# Patient Record
Sex: Male | Born: 1940 | Hispanic: No | Smoking: Never smoker
Health system: Southern US, Community
[De-identification: ages and names within clinical notes are randomized; demographics above are authoritative.]

## PROBLEM LIST (undated history)

## (undated) DIAGNOSIS — E46 Unspecified protein-calorie malnutrition: Secondary | ICD-10-CM

## (undated) DIAGNOSIS — D649 Anemia, unspecified: Secondary | ICD-10-CM

## (undated) DIAGNOSIS — IMO0002 Reserved for concepts with insufficient information to code with codable children: Secondary | ICD-10-CM

## (undated) DIAGNOSIS — J45909 Unspecified asthma, uncomplicated: Secondary | ICD-10-CM

## (undated) DIAGNOSIS — G9341 Metabolic encephalopathy: Secondary | ICD-10-CM

## (undated) DIAGNOSIS — C801 Malignant (primary) neoplasm, unspecified: Secondary | ICD-10-CM

## (undated) DIAGNOSIS — I10 Essential (primary) hypertension: Secondary | ICD-10-CM

## (undated) DIAGNOSIS — T66XXXA Radiation sickness, unspecified, initial encounter: Secondary | ICD-10-CM

## (undated) HISTORY — PX: ABDOMINAL AORTIC ANEURYSM REPAIR: SUR1152

---

## 2015-12-21 ENCOUNTER — Emergency Department
Admission: EM | Admit: 2015-12-21 | Discharge: 2015-12-22 | Disposition: A | Discharge status: Home / Self Care | Payer: Medicare Other | Attending: Emergency Medicine | Admitting: Emergency Medicine

## 2015-12-21 ENCOUNTER — Encounter: Payer: Self-pay | Admitting: *Deleted

## 2015-12-21 ENCOUNTER — Emergency Department: Payer: Medicare Other

## 2015-12-21 DIAGNOSIS — R1012 Left upper quadrant pain: Secondary | ICD-10-CM

## 2015-12-21 DIAGNOSIS — R1084 Generalized abdominal pain: Secondary | ICD-10-CM

## 2015-12-21 LAB — URINALYSIS COMPLETE WITH MICROSCOPIC (ARMC ONLY)
BACTERIA UA: NONE SEEN
Bilirubin Urine: NEGATIVE
GLUCOSE, UA: NEGATIVE mg/dL
HGB URINE DIPSTICK: NEGATIVE
Ketones, ur: NEGATIVE mg/dL
LEUKOCYTES UA: NEGATIVE
NITRITE: NEGATIVE
Protein, ur: NEGATIVE mg/dL
SPECIFIC GRAVITY, URINE: 1.012 (ref 1.005–1.030)
pH: 6 (ref 5.0–8.0)

## 2015-12-21 LAB — CBC
HCT: 37.9 % — ABNORMAL LOW (ref 40.0–52.0)
HEMOGLOBIN: 12.8 g/dL — AB (ref 13.0–18.0)
MCH: 28.1 pg (ref 26.0–34.0)
MCHC: 33.8 g/dL (ref 32.0–36.0)
MCV: 83.1 fL (ref 80.0–100.0)
PLATELETS: 240 10*3/uL (ref 150–440)
RBC: 4.55 MIL/uL (ref 4.40–5.90)
RDW: 15 % — ABNORMAL HIGH (ref 11.5–14.5)
WBC: 14.6 10*3/uL — AB (ref 3.8–10.6)

## 2015-12-21 LAB — COMPREHENSIVE METABOLIC PANEL
ALK PHOS: 80 U/L (ref 38–126)
ALT: 8 U/L — ABNORMAL LOW (ref 17–63)
ANION GAP: 9 (ref 5–15)
AST: 15 U/L (ref 15–41)
Albumin: 3 g/dL — ABNORMAL LOW (ref 3.5–5.0)
BILIRUBIN TOTAL: 0.7 mg/dL (ref 0.3–1.2)
BUN: 14 mg/dL (ref 6–20)
CALCIUM: 12 mg/dL — AB (ref 8.9–10.3)
CO2: 30 mmol/L (ref 22–32)
CREATININE: 0.98 mg/dL (ref 0.61–1.24)
Chloride: 99 mmol/L — ABNORMAL LOW (ref 101–111)
Glucose, Bld: 96 mg/dL (ref 65–99)
Potassium: 3.3 mmol/L — ABNORMAL LOW (ref 3.5–5.1)
SODIUM: 138 mmol/L (ref 135–145)
TOTAL PROTEIN: 7.1 g/dL (ref 6.5–8.1)

## 2015-12-21 LAB — LIPASE, BLOOD: Lipase: 15 U/L (ref 11–51)

## 2015-12-21 MED ORDER — IOPAMIDOL (ISOVUE-300) INJECTION 61%: 30.0000 mL | Freq: Once | INTRAVENOUS | Status: DC

## 2015-12-21 NOTE — ED Triage Notes (Signed)
Pt reports he has abd pain.  No n/v/d.  Pt states it feels like gas pain.  No back pain.  Last bm was yesterday.   Pt alert.

## 2015-12-21 NOTE — ED Provider Notes (Signed)
[facility] Emergency Department Provider Note    ____________________________________________   I have reviewed the triage vital signs and the nursing notes.   HISTORY  Chief Complaint Abdominal Pain   History limited by: Not Limited   HPI Brian Hughes is a 75 y.o. male who presents to the emergency department today because of concerns for abdominal pain. He states is going on off for roughly 4 months. He describes the pain is been somewhat generalized. It does sound like he gets worse after he eats because he feels he gets bloated with gas. He is currently seen a GI doctor who apparently recommended that he come to the emergency department he called today and said the pain was worse that he has not been eating and has had weight loss. Patient does state that he has had multiple pounds of weight loss have been unintentional. He denies any fevers.     No past medical history on file.  There are no active problems to display for this patient.   No past surgical history on file.  Prior to Admission medications   Not on File    Allergies Review of patient's allergies indicates no known allergies.  No family history on file.  Social History Social History  Substance Use Topics  . Smoking status: Never Smoker  . Smokeless tobacco: Never Used  . Alcohol use No    Review of Systems  Constitutional: Negative for fever. Positive for weight loss.  Cardiovascular: Negative for chest pain. Respiratory: Negative for shortness of breath. Gastrointestinal: Positive for abdominal pain. Genitourinary: Negative for dysuria. Musculoskeletal: Negative for back pain. Skin: Negative for rash. Neurological: Negative for headaches, focal weakness or numbness.  10-point ROS otherwise negative.  ____________________________________________   PHYSICAL EXAM:  VITAL SIGNS: ED Triage Vitals  Enc Vitals Group     BP 12/21/15 1752 (!) 143/105   Pulse Rate 12/21/15 1752 94     Resp 12/21/15 1752 20     Temp 12/21/15 1752 98.7 F (37.1 C)     Temp Source 12/21/15 1752 Oral     SpO2 12/21/15 1752 95 %     Weight 12/21/15 1753 217 lb 9 oz (98.7 kg)     Height 12/21/15 1753 6\' 4"  (1.93 m)     Head Circumference --      Peak Flow --      Pain Score 12/21/15 1757 7   Constitutional: Alert and oriented. Well appearing and in no distress. Eyes: Conjunctivae are normal. Normal extraocular movements. ENT   Head: Normocephalic and atraumatic.   Nose: No congestion/rhinnorhea.   Mouth/Throat: Mucous membranes are moist.   Neck: No stridor. Hematological/Lymphatic/Immunilogical: No cervical lymphadenopathy. Cardiovascular: Normal rate, regular rhythm.  No murmurs, rubs, or gallops. Respiratory: Normal respiratory effort without tachypnea nor retractions. Breath sounds are clear and equal bilaterally. No wheezes/rales/rhonchi. Gastrointestinal: Soft and minimally diffusely tender to palpation. No distention.  Genitourinary: Deferred Musculoskeletal: Normal range of motion in all extremities. No lower extremity edema. Neurologic:  Normal speech and language. No gross focal neurologic deficits are appreciated.  Skin:  Skin is warm, dry and intact. No rash noted. Psychiatric: Mood and affect are normal. Speech and behavior are normal. Patient exhibits appropriate insight and judgment.  ____________________________________________    LABS (pertinent positives/negatives)  Labs Reviewed  COMPREHENSIVE METABOLIC PANEL - Abnormal; Notable for the following:       Result Value   Potassium 3.3 (*)    Chloride 99 (*)    Calcium  12.0 (*)    Albumin 3.0 (*)    ALT 8 (*)    All other components within normal limits  CBC - Abnormal; Notable for the following:    WBC 14.6 (*)    Hemoglobin 12.8 (*)    HCT 37.9 (*)    RDW 15.0 (*)    All other components within normal limits  URINALYSIS COMPLETEWITH MICROSCOPIC ([facility] ONLY) -  Abnormal; Notable for the following:    Color, Urine YELLOW (*)    APPearance CLEAR (*)    Squamous Epithelial / LPF 0-5 (*)    All other components within normal limits  LIPASE, BLOOD     ____________________________________________   EKG  None  ____________________________________________    RADIOLOGY  Abd x-rays pending  ____________________________________________   PROCEDURES  Procedures  ____________________________________________   INITIAL IMPRESSION / ASSESSMENT AND PLAN / ED COURSE  Pertinent labs & imaging results that were available during my care of the patient were reviewed by me and considered in my medical decision making (see chart for details).  Patient presented to the emergency department today with primary concerns for abdominal pain. This is been a chronic issue for the past 4 months. Patient however is also concern for weight loss and weakness. Patient have a leukocytosis on his blood work. Abdominal was mildly diffusely tender to palpation. I did recommend CT scan however patient declined we will however. We will then get x-rays to evaluate for any abnormal gas patterns. Did explain to patient he might have concern for possible cancer given history of weight loss. Patient understood and still declined CT scan. Patient was also found to be slightly hypercalcemic. Patient states he does take calcium supplements. Was advised to stop taking this. Abdominal x-ray pending at time of sign out however will prepare  Work in the event that it's negative. ____________________________________________   FINAL CLINICAL IMPRESSION(S) / ED DIAGNOSES  Final diagnoses:  Generalized abdominal pain  Hypercalcemia     Note: This dictation was prepared with Dragon dictation. Any transcriptional errors that result from this process are unintentional    Nance Pear, MD 12/22/15 0006

## 2015-12-21 NOTE — ED Notes (Signed)
Pt reports that when he "takes my fist and pushes it in the my left upper stomach the pain goes away".  Pt denies that the pain increases afterwards, but states that the pain does come back when he releases the pressure.  Dr. Archie Balboa notified.

## 2015-12-21 NOTE — ED Notes (Signed)
Pt refusing CT and MRI at this time.  Pt states that it makes him weaker.  EDP at bedside and notified of this.

## 2015-12-22 MED ORDER — SODIUM CHLORIDE 0.9 % IV BOLUS (SEPSIS)
1000.0000 mL | Freq: Once | INTRAVENOUS | Status: AC
  Administered 2015-12-22: 1000 mL via INTRAVENOUS

## 2015-12-22 MED ORDER — SIMETHICONE 80 MG PO CHEW: 80.0000 mg | CHEWABLE_TABLET | Freq: Three times a day (TID) | ORAL | 2 refills | Status: AC

## 2015-12-22 NOTE — ED Provider Notes (Signed)
-----------------------------------------   12:44 AM on 12/22/2015 -----------------------------------------  Patient care is him from Dr. Archie Balboa. Patient's abdominal x-rays unrevealing. Patient's labs are largely within normal limits besides elevated calcium. I discussed with the patient decreasing his calcium supplementation from twice daily to once daily. Patient does have a mild leukocytosis of 14,000. On my abdominal exam the patient is nontender, states it only hurts if you push very hard with the point of your finger. Urinalysis is normal. I again discussed with the patient obtaining a CT scan. Patient states both times he has had CT scans as made extremely weak, and he is adamantly opposed to obtaining a CT scan. I discussed obtaining a CT renal scan which would be no contrast of any sort, the patient states that is what he had last time he still felt extremely weak after obtaining the CT scan. Patient states he will call his GI doctor tomorrow. Patient is describing a large amount of abdominal gas when he eats which then prevents him from continuing to eat. We will place the patient on simethicone, he will call GI medicine tomorrow. Patient is agreeable to this plan.   Harvest Dark, MD 12/22/15 [phone]

## 2015-12-22 NOTE — Discharge Instructions (Signed)
Please STOP taking your calcium supplements. You have too much calcium in your blood. Please seek medical attention for any high fevers, chest pain, shortness of breath, change in behavior, persistent vomiting, bloody stool or any other new or concerning symptoms.

## 2016-01-18 ENCOUNTER — Emergency Department: Payer: Medicare Other

## 2016-01-18 ENCOUNTER — Encounter: Payer: Self-pay | Admitting: Emergency Medicine

## 2016-01-18 ENCOUNTER — Inpatient Hospital Stay
Admission: EM | Admit: 2016-01-18 | Discharge: 2016-02-02 | DRG: 823 | Disposition: A | Discharge status: Hospice | Payer: Medicare Other | Attending: Family Medicine | Admitting: Family Medicine

## 2016-01-18 DIAGNOSIS — C801 Malignant (primary) neoplasm, unspecified: Secondary | ICD-10-CM

## 2016-01-18 DIAGNOSIS — Z6829 Body mass index (BMI) 29.0-29.9, adult: Secondary | ICD-10-CM

## 2016-01-18 DIAGNOSIS — K5900 Constipation, unspecified: Secondary | ICD-10-CM

## 2016-01-18 DIAGNOSIS — I517 Cardiomegaly: Secondary | ICD-10-CM | POA: Diagnosis not present

## 2016-01-18 DIAGNOSIS — I714 Abdominal aortic aneurysm, without rupture, unspecified: Secondary | ICD-10-CM

## 2016-01-18 DIAGNOSIS — Z515 Encounter for palliative care: Secondary | ICD-10-CM | POA: Diagnosis present

## 2016-01-18 DIAGNOSIS — R0902 Hypoxemia: Secondary | ICD-10-CM | POA: Diagnosis present

## 2016-01-18 DIAGNOSIS — R32 Unspecified urinary incontinence: Secondary | ICD-10-CM | POA: Diagnosis not present

## 2016-01-18 DIAGNOSIS — R1902 Left upper quadrant abdominal swelling, mass and lump: Secondary | ICD-10-CM

## 2016-01-18 DIAGNOSIS — C8333 Diffuse large B-cell lymphoma, intra-abdominal lymph nodes: Principal | ICD-10-CM | POA: Diagnosis present

## 2016-01-18 DIAGNOSIS — C8590 Non-Hodgkin lymphoma, unspecified, unspecified site: Secondary | ICD-10-CM | POA: Diagnosis not present

## 2016-01-18 DIAGNOSIS — M6281 Muscle weakness (generalized): Secondary | ICD-10-CM

## 2016-01-18 DIAGNOSIS — K567 Ileus, unspecified: Secondary | ICD-10-CM | POA: Diagnosis not present

## 2016-01-18 DIAGNOSIS — I10 Essential (primary) hypertension: Secondary | ICD-10-CM | POA: Diagnosis not present

## 2016-01-18 DIAGNOSIS — Z9109 Other allergy status, other than to drugs and biological substances: Secondary | ICD-10-CM

## 2016-01-18 DIAGNOSIS — G9341 Metabolic encephalopathy: Secondary | ICD-10-CM | POA: Diagnosis not present

## 2016-01-18 DIAGNOSIS — E43 Unspecified severe protein-calorie malnutrition: Secondary | ICD-10-CM | POA: Diagnosis present

## 2016-01-18 DIAGNOSIS — T451X5A Adverse effect of antineoplastic and immunosuppressive drugs, initial encounter: Secondary | ICD-10-CM | POA: Diagnosis not present

## 2016-01-18 DIAGNOSIS — J449 Chronic obstructive pulmonary disease, unspecified: Secondary | ICD-10-CM | POA: Diagnosis present

## 2016-01-18 DIAGNOSIS — E790 Hyperuricemia without signs of inflammatory arthritis and tophaceous disease: Secondary | ICD-10-CM | POA: Diagnosis present

## 2016-01-18 DIAGNOSIS — D72829 Elevated white blood cell count, unspecified: Secondary | ICD-10-CM | POA: Diagnosis present

## 2016-01-18 DIAGNOSIS — Y92239 Unspecified place in hospital as the place of occurrence of the external cause: Secondary | ICD-10-CM | POA: Diagnosis not present

## 2016-01-18 DIAGNOSIS — Z01810 Encounter for preprocedural cardiovascular examination: Secondary | ICD-10-CM | POA: Diagnosis not present

## 2016-01-18 DIAGNOSIS — Z66 Do not resuscitate: Secondary | ICD-10-CM | POA: Diagnosis present

## 2016-01-18 DIAGNOSIS — R410 Disorientation, unspecified: Secondary | ICD-10-CM | POA: Diagnosis not present

## 2016-01-18 DIAGNOSIS — K869 Disease of pancreas, unspecified: Secondary | ICD-10-CM | POA: Diagnosis not present

## 2016-01-18 DIAGNOSIS — R1900 Intra-abdominal and pelvic swelling, mass and lump, unspecified site: Secondary | ICD-10-CM

## 2016-01-18 DIAGNOSIS — E8770 Fluid overload, unspecified: Secondary | ICD-10-CM | POA: Diagnosis not present

## 2016-01-18 DIAGNOSIS — R2681 Unsteadiness on feet: Secondary | ICD-10-CM

## 2016-01-18 DIAGNOSIS — R140 Abdominal distension (gaseous): Secondary | ICD-10-CM

## 2016-01-18 DIAGNOSIS — J690 Pneumonitis due to inhalation of food and vomit: Secondary | ICD-10-CM

## 2016-01-18 DIAGNOSIS — C8593 Non-Hodgkin lymphoma, unspecified, intra-abdominal lymph nodes: Secondary | ICD-10-CM | POA: Diagnosis not present

## 2016-01-18 DIAGNOSIS — K921 Melena: Secondary | ICD-10-CM

## 2016-01-18 DIAGNOSIS — R1084 Generalized abdominal pain: Secondary | ICD-10-CM | POA: Diagnosis not present

## 2016-01-18 DIAGNOSIS — Z7189 Other specified counseling: Secondary | ICD-10-CM

## 2016-01-18 DIAGNOSIS — E876 Hypokalemia: Secondary | ICD-10-CM | POA: Diagnosis present

## 2016-01-18 DIAGNOSIS — Z79899 Other long term (current) drug therapy: Secondary | ICD-10-CM

## 2016-01-18 DIAGNOSIS — R1907 Generalized intra-abdominal and pelvic swelling, mass and lump: Secondary | ICD-10-CM

## 2016-01-18 DIAGNOSIS — J90 Pleural effusion, not elsewhere classified: Secondary | ICD-10-CM | POA: Diagnosis not present

## 2016-01-18 DIAGNOSIS — R1909 Other intra-abdominal and pelvic swelling, mass and lump: Secondary | ICD-10-CM | POA: Diagnosis not present

## 2016-01-18 DIAGNOSIS — R58 Hemorrhage, not elsewhere classified: Secondary | ICD-10-CM

## 2016-01-18 DIAGNOSIS — R451 Restlessness and agitation: Secondary | ICD-10-CM | POA: Diagnosis not present

## 2016-01-18 DIAGNOSIS — D709 Neutropenia, unspecified: Secondary | ICD-10-CM | POA: Diagnosis not present

## 2016-01-18 DIAGNOSIS — K922 Gastrointestinal hemorrhage, unspecified: Secondary | ICD-10-CM

## 2016-01-18 HISTORY — DX: Radiation sickness, unspecified, initial encounter: T66.XXXA

## 2016-01-18 HISTORY — DX: Anemia, unspecified: D64.9

## 2016-01-18 HISTORY — DX: Hypercalcemia: E83.52

## 2016-01-18 HISTORY — DX: Metabolic encephalopathy: G93.41

## 2016-01-18 HISTORY — DX: Unspecified asthma, uncomplicated: J45.909

## 2016-01-18 HISTORY — DX: Reserved for concepts with insufficient information to code with codable children: IMO0002

## 2016-01-18 HISTORY — DX: Malignant (primary) neoplasm, unspecified: C80.1

## 2016-01-18 HISTORY — DX: Unspecified protein-calorie malnutrition: E46

## 2016-01-18 HISTORY — DX: Essential (primary) hypertension: I10

## 2016-01-18 LAB — BASIC METABOLIC PANEL
ANION GAP: 12 (ref 5–15)
BUN: 22 mg/dL — ABNORMAL HIGH (ref 6–20)
CALCIUM: 14.8 mg/dL — AB (ref 8.9–10.3)
CO2: 36 mmol/L — AB (ref 22–32)
Chloride: 87 mmol/L — ABNORMAL LOW (ref 101–111)
Creatinine, Ser: 1.1 mg/dL (ref 0.61–1.24)
GFR calc non Af Amer: >60 mL/min (ref >60)
Glucose, Bld: 125 mg/dL — ABNORMAL HIGH (ref 65–99)
POTASSIUM: 2.9 mmol/L — AB (ref 3.5–5.1)
Sodium: 135 mmol/L (ref 135–145)

## 2016-01-18 LAB — CBC
HEMATOCRIT: 36.8 % — AB (ref 40.0–52.0)
HEMOGLOBIN: 12.4 g/dL — AB (ref 13.0–18.0)
MCH: 28 pg (ref 26.0–34.0)
MCHC: 33.6 g/dL (ref 32.0–36.0)
MCV: 83.3 fL (ref 80.0–100.0)
Platelets: 404 10*3/uL (ref 150–440)
RBC: 4.42 MIL/uL (ref 4.40–5.90)
RDW: 15.2 % — ABNORMAL HIGH (ref 11.5–14.5)
WBC: 18.5 10*3/uL — ABNORMAL HIGH (ref 3.8–10.6)

## 2016-01-18 LAB — URINALYSIS COMPLETE WITH MICROSCOPIC (ARMC ONLY)
BACTERIA UA: NONE SEEN
Bilirubin Urine: NEGATIVE
Glucose, UA: NEGATIVE mg/dL
Hgb urine dipstick: NEGATIVE
KETONES UR: NEGATIVE mg/dL
Leukocytes, UA: NEGATIVE
NITRITE: NEGATIVE
PH: 5 (ref 5.0–8.0)
PROTEIN: NEGATIVE mg/dL
SPECIFIC GRAVITY, URINE: 1.014 (ref 1.005–1.030)

## 2016-01-18 LAB — TROPONIN I: Troponin I: <0.03 ng/mL (ref <0.03)

## 2016-01-18 LAB — AMMONIA: Ammonia: 9 umol/L (ref 9–35)

## 2016-01-18 LAB — TSH
TSH: 1.789 u[IU]/mL (ref 0.350–4.500)
TSH: 1.406 u[IU]/mL (ref 0.350–4.500)

## 2016-01-18 LAB — HEPATIC FUNCTION PANEL
ALBUMIN: 2.5 g/dL — AB (ref 3.5–5.0)
ALK PHOS: 72 U/L (ref 38–126)
ALT: 10 U/L — ABNORMAL LOW (ref 17–63)
AST: 23 U/L (ref 15–41)
BILIRUBIN TOTAL: 0.9 mg/dL (ref 0.3–1.2)
Bilirubin, Direct: 0.2 mg/dL (ref 0.1–0.5)
Indirect Bilirubin: 0.7 mg/dL (ref 0.3–0.9)
Total Protein: 6.9 g/dL (ref 6.5–8.1)

## 2016-01-18 LAB — MAGNESIUM: MAGNESIUM: 1.7 mg/dL (ref 1.7–2.4)

## 2016-01-18 MED ORDER — BISACODYL 10 MG RE SUPP: 10.0000 mg | Freq: Every day | RECTAL | Status: DC | PRN

## 2016-01-18 MED ORDER — DOCUSATE SODIUM 100 MG PO CAPS
100.0000 mg | ORAL_CAPSULE | Freq: Two times a day (BID) | ORAL | Status: DC
  Administered 2016-01-19 – 2016-01-27 (×17): 100 mg via ORAL
  Filled 2016-01-18 (×20): qty 1

## 2016-01-18 MED ORDER — SODIUM CHLORIDE 0.9 % IV BOLUS (SEPSIS)
1000.0000 mL | Freq: Once | INTRAVENOUS | Status: AC
  Administered 2016-01-18: 1000 mL via INTRAVENOUS

## 2016-01-18 MED ORDER — POTASSIUM CHLORIDE IN NACL 20-0.9 MEQ/L-% IV SOLN
INTRAVENOUS | Status: DC
  Administered 2016-01-19 – 2016-01-20 (×5): via INTRAVENOUS
  Administered 2016-01-20: 200 mL/h via INTRAVENOUS
  Administered 2016-01-21 – 2016-01-22 (×5): via INTRAVENOUS
  Filled 2016-01-18 (×15): qty 1000

## 2016-01-18 MED ORDER — ONDANSETRON HCL 4 MG PO TABS: 4.0000 mg | ORAL_TABLET | Freq: Four times a day (QID) | ORAL | Status: DC | PRN

## 2016-01-18 MED ORDER — POTASSIUM CHLORIDE CRYS ER 20 MEQ PO TBCR: 40.0000 meq | EXTENDED_RELEASE_TABLET | ORAL | Status: DC

## 2016-01-18 MED ORDER — SUCRALFATE 1 G PO TABS
1.0000 g | ORAL_TABLET | Freq: Every day | ORAL | Status: DC
  Administered 2016-01-19 – 2016-01-20 (×2): 1 g via ORAL
  Filled 2016-01-18 (×2): qty 1

## 2016-01-18 MED ORDER — POTASSIUM CHLORIDE 10 MEQ/100ML IV SOLN
10.0000 meq | INTRAVENOUS | Status: DC
Start: 2016-01-18 — End: 2016-01-18

## 2016-01-18 MED ORDER — SODIUM CHLORIDE 0.9 % IV SOLN
INTRAVENOUS | Status: DC
  Administered 2016-01-18: 21:00:00 via INTRAVENOUS
  Filled 2016-01-18 (×2): qty 100

## 2016-01-18 MED ORDER — FENTANYL CITRATE (PF) 100 MCG/2ML IJ SOLN
50.0000 ug | Freq: Once | INTRAMUSCULAR | Status: AC
  Administered 2016-01-18: 50 ug via INTRAVENOUS
  Filled 2016-01-18: qty 2

## 2016-01-18 MED ORDER — IOPAMIDOL (ISOVUE-300) INJECTION 61%
100.0000 mL | Freq: Once | INTRAVENOUS | Status: AC | PRN
  Administered 2016-01-18: 100 mL via INTRAVENOUS

## 2016-01-18 MED ORDER — ONDANSETRON HCL 4 MG/2ML IJ SOLN
4.0000 mg | Freq: Once | INTRAMUSCULAR | Status: AC
  Administered 2016-01-18: 4 mg via INTRAVENOUS
  Filled 2016-01-18: qty 2

## 2016-01-18 MED ORDER — HYDROCODONE-ACETAMINOPHEN 5-325 MG PO TABS
1.0000 | ORAL_TABLET | ORAL | Status: DC | PRN
  Administered 2016-01-18: 1 via ORAL
  Administered 2016-01-20 – 2016-01-25 (×12): 2 via ORAL
  Administered 2016-01-26: 04:00:00 1 via ORAL
  Administered 2016-01-26 – 2016-01-30 (×10): 2 via ORAL
  Filled 2016-01-18 (×21): qty 2
  Filled 2016-01-18 (×2): qty 1
  Filled 2016-01-18 (×2): qty 2

## 2016-01-18 MED ORDER — PANTOPRAZOLE SODIUM 40 MG PO TBEC
40.0000 mg | DELAYED_RELEASE_TABLET | Freq: Every day | ORAL | Status: DC
  Administered 2016-01-19 – 2016-01-29 (×10): 40 mg via ORAL
  Filled 2016-01-18 (×11): qty 1

## 2016-01-18 MED ORDER — PIPERACILLIN-TAZOBACTAM 3.375 G IVPB 30 MIN
3.3750 g | Freq: Once | INTRAVENOUS | Status: AC
  Administered 2016-01-18: 3.375 g via INTRAVENOUS
  Filled 2016-01-18: qty 50

## 2016-01-18 MED ORDER — SODIUM CHLORIDE 0.9 % IV BOLUS (SEPSIS)
1000.0000 mL | Freq: Once | INTRAVENOUS | Status: AC
  Administered 2016-01-19: 01:00:00 1000 mL via INTRAVENOUS

## 2016-01-18 MED ORDER — LISINOPRIL 20 MG PO TABS
40.0000 mg | ORAL_TABLET | Freq: Every day | ORAL | Status: DC
  Administered 2016-01-19 – 2016-01-20 (×2): 40 mg via ORAL
  Filled 2016-01-18 (×2): qty 2

## 2016-01-18 MED ORDER — POTASSIUM CHLORIDE CRYS ER 20 MEQ PO TBCR
40.0000 meq | EXTENDED_RELEASE_TABLET | Freq: Once | ORAL | Status: AC
  Administered 2016-01-18: 40 meq via ORAL
  Filled 2016-01-18: qty 2

## 2016-01-18 MED ORDER — ONDANSETRON HCL 4 MG/2ML IJ SOLN: 4.0000 mg | Freq: Four times a day (QID) | INTRAMUSCULAR | Status: DC | PRN

## 2016-01-18 MED ORDER — ALBUTEROL SULFATE (2.5 MG/3ML) 0.083% IN NEBU
2.5000 mg | INHALATION_SOLUTION | RESPIRATORY_TRACT | Status: DC | PRN
  Administered 2016-01-23 – 2016-01-30 (×5): 2.5 mg via RESPIRATORY_TRACT
  Filled 2016-01-18 (×5): qty 3

## 2016-01-18 MED ORDER — ENOXAPARIN SODIUM 40 MG/0.4ML ~~LOC~~ SOLN
40.0000 mg | SUBCUTANEOUS | Status: DC
  Administered 2016-01-19 – 2016-01-28 (×8): 40 mg via SUBCUTANEOUS
  Filled 2016-01-18 (×9): qty 0.4

## 2016-01-18 MED ORDER — IOPAMIDOL (ISOVUE-300) INJECTION 61%
30.0000 mL | Freq: Once | INTRAVENOUS | Status: AC
  Administered 2016-01-18: 30 mL via ORAL

## 2016-01-18 MED ORDER — FUROSEMIDE 10 MG/ML IJ SOLN
40.0000 mg | Freq: Once | INTRAMUSCULAR | Status: AC
  Administered 2016-01-19: 01:00:00 40 mg via INTRAVENOUS
  Filled 2016-01-18: qty 4

## 2016-01-18 MED ORDER — ACETAMINOPHEN 325 MG PO TABS
650.0000 mg | ORAL_TABLET | Freq: Four times a day (QID) | ORAL | Status: DC | PRN
  Administered 2016-01-28: 09:00:00 650 mg via ORAL
  Filled 2016-01-18: qty 2

## 2016-01-18 MED ORDER — ALBUTEROL SULFATE HFA 108 (90 BASE) MCG/ACT IN AERS: 2.0000 | INHALATION_SPRAY | RESPIRATORY_TRACT | Status: DC | PRN

## 2016-01-18 MED ORDER — VANCOMYCIN HCL 10 G IV SOLR
1500.0000 mg | Freq: Once | INTRAVENOUS | Status: AC
  Administered 2016-01-18: 1500 mg via INTRAVENOUS
  Filled 2016-01-18: qty 1500

## 2016-01-18 MED ORDER — POLYETHYLENE GLYCOL 3350 17 G PO PACK: 17.0000 g | PACK | Freq: Every day | ORAL | Status: DC | PRN

## 2016-01-18 MED ORDER — ACETAMINOPHEN 650 MG RE SUPP: 650.0000 mg | Freq: Four times a day (QID) | RECTAL | Status: DC | PRN

## 2016-01-18 MED ORDER — SODIUM CHLORIDE 0.9% FLUSH
3.0000 mL | Freq: Two times a day (BID) | INTRAVENOUS | Status: DC
  Administered 2016-01-19 – 2016-01-30 (×13): 3 mL via INTRAVENOUS

## 2016-01-18 MED ORDER — SENNA 8.6 MG PO TABS
1.0000 | ORAL_TABLET | Freq: Two times a day (BID) | ORAL | Status: DC
  Administered 2016-01-19 – 2016-01-27 (×17): 8.6 mg via ORAL
  Filled 2016-01-18 (×19): qty 1

## 2016-01-18 NOTE — ED Notes (Signed)
Admitting MD at bedside.

## 2016-01-18 NOTE — ED Notes (Addendum)
Patient transported to XR. 

## 2016-01-18 NOTE — H&P (Addendum)
[facility] [place] [facility] [place] NAME: Justn Enterline    MR#:  [id number]  DATE OF BIRTH:  Dec 14, 1940  DATE OF ADMISSION:  01/18/2016  PRIMARY CARE PHYSICIAN: No PCP Per Patient   REQUESTING/REFERRING PHYSICIAN: Dr. Mariea Clonts  CHIEF COMPLAINT:   Chief Complaint  Patient presents with  . Weakness    HISTORY OF PRESENT ILLNESS:  Karl Buhrman  is a 75 y.o. male with a known history of Asthma, hypertension presents to the emergency room complaining of many months of vague abdominal pain. He has had worsening weakness some on and off confusion as per his friend at bedside. Patient was seen by GI Dr. Vira Agar in September and was scheduled for a colonoscopy. Date unknown. Here in the emergency room a CT scan of the abdomen and pelvis was done which shows a large 20 cm mass extending over the bowel spleen and pancreas. Primary unknown. High suspicion for lymphoma. She raises concern for lingular pneumonia but CT scan of the abdomen shows no infiltrates in the lower part of the lungs. IV Zosyn and vancomycin given to the patient. He has lost 40 pounds in the last 3 months. Poor appetite. Increased urination. He has hypokalemia and hypercalcemia on blood work.  PAST MEDICAL HISTORY:   Past Medical History:  Diagnosis Date  . Asthma   . Cancer ([place])   . Hypertension     PAST SURGICAL HISTORY:  No past surgical history on file.  SOCIAL HISTORY:   Social History  Substance Use Topics  . Smoking status: Never Smoker  . Smokeless tobacco: Never Used  . Alcohol use No    FAMILY HISTORY:   Family History  Problem Relation Age of Onset  . Jaundice Father     DRUG ALLERGIES:   Allergies  Allergen Reactions  . Bupropion Nausea Only and Nausea And Vomiting  . Flunisolide     Other reaction(s): Other (See Comments) Wheezing  . Lisinopril Cough  . Mometasone     Other reaction(s): Other (See Comments) Wheezing  . Terazosin     Other  reaction(s): Other (See Comments) Fatigue  . Valsartan Cough    REVIEW OF SYSTEMS:   Review of Systems  Constitutional: Positive for malaise/fatigue and weight loss. Negative for chills and fever.  HENT: Negative for hearing loss and nosebleeds.   Eyes: Negative for blurred vision, double vision and pain.  Respiratory: Negative for cough, hemoptysis, sputum production, shortness of breath and wheezing.   Cardiovascular: Negative for chest pain, palpitations, orthopnea and leg swelling.  Gastrointestinal: Positive for abdominal pain, constipation, nausea and vomiting. Negative for diarrhea.  Genitourinary: Negative for dysuria and hematuria.  Musculoskeletal: Positive for back pain. Negative for falls and myalgias.  Skin: Negative for rash.  Neurological: Positive for dizziness and weakness. Negative for tremors, sensory change, speech change, focal weakness, seizures and headaches.  Endo/Heme/Allergies: Does not bruise/bleed easily.  Psychiatric/Behavioral: Positive for memory loss. Negative for depression. The patient is not nervous/anxious.     MEDICATIONS AT HOME:   Prior to Admission medications   Medication Sig Start Date End Date Taking? Authorizing Provider  lisinopril-hydrochlorothiazide (PRINZIDE,ZESTORETIC) 10-12.5 MG tablet Take 1 tablet by mouth daily. 11/15/15  Yes Historical Provider, MD  pantoprazole (PROTONIX) 40 MG tablet Take 1 tablet by mouth daily. 11/15/15  Yes Historical Provider, MD  simethicone (GAS-X) 80 MG chewable tablet Chew 1 tablet (80 mg total) by mouth 3 (three) times daily. 12/22/15 12/21/16 Yes Harvest Dark, MD  sucralfate (  CARAFATE) 1 g tablet Take 1 tablet by mouth daily. 12/13/15  Yes Historical Provider, MD  VENTOLIN HFA 108 (90 Base) MCG/ACT inhaler Inhale 1-2 puffs into the lungs every 6 (six) hours as needed.  10/15/15  Yes Historical Provider, MD     VITAL SIGNS:  Blood pressure (!) 148/73, pulse 75, temperature 97.4 F (36.3 C), temperature  source Oral, resp. rate (!) 23, height 6\' 4"  (1.93 m), weight 90.7 kg (200 lb), SpO2 94 %.  PHYSICAL EXAMINATION:  Physical Exam  GENERAL:  75 y.o.-year-old patient lying in the bed with no acute distress.  EYES: Pupils equal, round, reactive to light and accommodation. No scleral icterus. Extraocular muscles intact.  HEENT: Head atraumatic, normocephalic. Oropharynx and nasopharynx clear. No oropharyngeal erythema, moist oral mucosa  NECK:  Supple, no jugular venous distention. No thyroid enlargement, no tenderness.  LUNGS: Normal breath sounds bilaterally, no wheezing, rales, rhonchi. No use of accessory muscles of respiration.  CARDIOVASCULAR: S1, S2 normal. No murmurs, rubs, or gallops.  ABDOMEN: Soft, nondistended. Bowel sounds present. No organomegaly or mass. Left flank tenderness on deep palpation. EXTREMITIES: No pedal edema, cyanosis, or clubbing. + 2 pedal & radial pulses b/l.   NEUROLOGIC: Cranial nerves II through XII are intact. No focal Motor or sensory deficits appreciated b/l PSYCHIATRIC: The patient is alert and oriented x 3. Good affect.  SKIN: No obvious rash, lesion, or ulcer.   LABORATORY PANEL:   CBC  Recent Labs Lab 01/18/16 1555  WBC 18.5*  HGB 12.4*  HCT 36.8*  PLT 404   ------------------------------------------------------------------------------------------------------------------  Chemistries   Recent Labs Lab 01/18/16 1555 01/18/16 1911  NA 135  --   K 2.9*  --   CL 87*  --   CO2 36*  --   GLUCOSE 125*  --   BUN 22*  --   CREATININE 1.10  --   CALCIUM 14.8*  --   AST  --  23  ALT  --  10*  ALKPHOS  --  72  BILITOT  --  0.9   ------------------------------------------------------------------------------------------------------------------  Cardiac Enzymes No results for input(s): TROPONINI in the last 168  hours. ------------------------------------------------------------------------------------------------------------------  RADIOLOGY:  Dg Chest 2 View  Result Date: 01/18/2016 CLINICAL DATA:  Pt says he has been weak and unable to walk and be steady on his feet for about 3 weeks. Hx of asthma. Former smoker EXAM: CHEST  2 VIEW COMPARISON:  12/21/2015 FINDINGS: Heart size is normal. There is patchy infiltrate in the lingula consistent with infectious process. No pulmonary edema. No pleural effusions. Mild bronchitic changes are present. IMPRESSION: 1. Bronchitic changes. 2. Infiltrate in the lingula. Electronically Signed   By: Nolon Nations M.D.   On: 01/18/2016 19:31   Ct Head Wo Contrast  Result Date: 01/18/2016 CLINICAL DATA:  75 year old male with abdominal pain, large abdominal tumor, increased weakness. Falls and inability to walk. Initial encounter. EXAM: CT HEAD WITHOUT CONTRAST TECHNIQUE: Contiguous axial images were obtained from the base of the skull through the vertex without intravenous contrast. COMPARISON:  CT Abdomen and Pelvis from today reported separately. 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 No midline shift, ventriculomegaly, mass effect, parenchymal mass lesion, intracranial hemorrhage or evidence of cortically based acute infarction. Gray-white matter differentiation is within normal limits throughout the brain. Cerebral volume is within normal limits for age. Vascular: Calcified atherosclerosis at the skull base. Skull: No osseous abnormality identified. Sinuses/Orbits: Visualized paranasal sinuses and mastoids are  well pneumatized. Right maxillary periosteal thickening. Other: No acute orbit or scalp soft tissue finding. IMPRESSION: Essentially normal for age noncontrast CT appearance of the brain. Note that early metastatic disease to the brain is difficult to exclude in the absence of intravenous  contrast, and Brain MRI without and with contrast would best stage the brain as necessary. Electronically Signed   By: Genevie Ann M.D.   On: 01/18/2016 20:40   Ct Abdomen Pelvis W Contrast  Addendum Date: 01/18/2016   ADDENDUM REPORT: 01/18/2016 20:46 ADDENDUM: Study discussed by telephone with Dr. Eula Listen on 01/18/2016 at 20:45 . Electronically Signed   By: Genevie Ann M.D.   On: 01/18/2016 20:46   Result Date: 01/18/2016 CLINICAL DATA:  75 year old male with bilateral lower abdominal pain. Increased weakness. Falls. Inability to walk. Initial encounter. EXAM: CT ABDOMEN AND PELVIS WITH CONTRAST TECHNIQUE: Multidetector CT imaging of the abdomen and pelvis was performed using the standard protocol following bolus administration of intravenous contrast. CONTRAST:  170mL ISOVUE-300 IOPAMIDOL (ISOVUE-300) INJECTION 61% COMPARISON:  Chest and abdominal radiographic series 10517. FINDINGS: Lower chest: No pericardial or pleural effusion. Lower lobe subpleural reticular opacity. No upper abdominal free air. Hepatobiliary: Negative liver. Surgically absent gallbladder. No intra or extrahepatic biliary duct enlargement. Pancreas/Spleen: There is a large heterogeneously enhancing solid mass occupying the left upper quadrant and lesser sac inseparable from the spleen, the proximal stomach, left adrenal gland, and the pancreatic body and tail. The mass encompasses 19.7 x 19.6 x 16 cm (AP by transverse by CC). The splenic flexure appears to be inferiorly displaced along the anterior and caudal aspect of the large tumor. There is mass effect on the proximal gastric lumen. The pancreatic head and uncinate process are unaffected and appear normal. There is superimposed extensive gastrohepatic ligament, retrocrural, and celiac axis lymphadenopathy, with individual nodal enlargement up to 3.6 cm short axis. New numerous small but increased in number mesenteric nodes throughout the root of the mesenteric. Adrenals/Urinary  Tract: The left adrenal gland is involved as above. The right adrenal gland is normal. Bilateral renal enhancement and post contrast excretion is within normal limits. Stomach/Bowel: Abnormal proximal stomach as above. Oral contrast was administered and has reached the distal small bowel, but not yet reached the terminal ileum. The duodenum and proximal small bowel loops are not directly involved by the large tumor. As stated above, the splenic flexure appears displaced and not directly involved. There is widespread diverticulosis of the colon. The large bowel is largely decompressed. No dilated small bowel. Vascular/Lymphatic: Large up to 6.4 cm diameter infrarenal abdominal aortic aneurysm with prominent mural plaque or thrombus. See series 2, image 54 and sagittal image 64. Calcified aortic atherosclerosis. Major arterial structures in the abdomen and pelvis remain patent. 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 The portal venous system is patent. Extensive upper abdominal lymphadenopathy as stated above. Reproductive: Penile implant. Other: Small volume pelvic free fluid. Unremarkable urinary bladder. Musculoskeletal: No acute or suspicious osseous lesion identified. IMPRESSION: 1. Large, nearly 20 cm heterogeneously enhancing and partially cystic or necrotic soft tissue mass inseparable from the proximal stomach, spleen, body and tail of the pancreas, and left adrenal gland. Extensive upper abdominal and retrocrural lymphadenopathy. No evidence of liver metastatic disease or biliary obstruction. Top differential considerations include Lymphoma, GIST, other gastric carcinoma, other primary splenic malignancy, and less likely pancreatic carcinoma. 2. Large up to 6.4 cm infrarenal abdominal aortic aneurysm. No evidence of acute rupture. Vascular surgery consultation recommended  due to increased risk of rupture for AAA >5.5 cm. This recommendation follows ACR consensus  guidelines: White Paper of the ACR Incidental Findings Committee II on Vascular Findings. J Am Coll Radiol 2013; 10:789-794. 3. 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 Electronically Signed: By: Genevie Ann M.D. 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 Monitor Lasix. May need Zometa depending on response to therapy.  * Large abdominal mass of unknown primary High suspicion for lymphoma. Consult oncology. Pain meds added. Will need PET/CT as outpatient.  * Hypoxia Chest x-ray raise concern for a possible lingular pneumonia. CT scan of the abdomen shows no infiltrates the lingular area on the lungs. He likely has COPD with his 52-pack-year smoking history. Wean oxygen as tolerated. Nebs added.  * Leukocytosis likely due to malignancy  * Hypokalemia. Replace orally and IV.  All the records are reviewed and case discussed with ED provider. Management plans discussed with the patient, family and they are in agreement.  CODE STATUS: DNR  TOTAL TIME TAKING CARE OF THIS PATIENT: 40 minutes.   Discussed with patient in the presence of his friend at bedside. Discussed findings of CT scan and presence of large tumor. Patient in tears. Has had symptoms for many months and was waiting for an endoscopy procedure. He mentions he is interested in finding out what kind of tumor he has an options of treatment. Is hoping he will improve and return to baseline. Once endoscopy procedures or biopsy if needed. Does not want any artificial life support including ventilator or resuscitation. Orders entered  for DO NOT RESUSCITATE. Son is his healthcare power of attorney.  Advance care planning 20 min  Hillary Bow R M.D on 01/18/2016 at 9:18 PM  Between 7am to 6pm - Pager - [phone]  After 6pm go to [email or web address]  - password [email or web address] [facility]  Office  [phone]  CC: Primary care physician; No PCP Per Patient  Note: This dictation was prepared with Dragon dictation along with smaller phrase technology. Any transcriptional errors that result from this process are unintentional.

## 2016-01-18 NOTE — ED Notes (Signed)
Pt c/o abd pain x 3 months, denies N/V/D, CP and SOB.  Pt sts that he has had weakness x 2 days, "to the point that I cannot walk".  Pt alert and oriented, resp even and unlabored.

## 2016-01-18 NOTE — ED Notes (Signed)
Calcium 14.8, reported to Dr Mariea Clonts,

## 2016-01-18 NOTE — ED Provider Notes (Signed)
[facility] Emergency Department Provider Note  ____________________________________________  Time seen: Approximately 6:49 PM  I have reviewed the triage vital signs and the nursing notes.   HISTORY  Chief Complaint Weakness    HPI Brian Hughes is a 75 y.o. male with a history of hypertension, hypokalemia presenting for generalized weakness. The patient reports that for the past 4 months he has had a constant left-sided abdominal pain. He reports that he is being worked up by GI, has undergone colonoscopy which was reportedly negative. He is scheduled for endoscopy because of anemia. He has not had any associated nausea vomiting or diarrhea, fevers or chills, urinary symptoms. He states that today, he had generalized fatigue that was so bad he was unable to walk. He also reports that he has been feeling confused over the last several weeks. He denies any cough or cold symptoms, trauma.  The patient is a poor historian and gives multiple different histories that are difficult to follow sequentially. Patient denies alcohol or illicit drug abuse.   Past Medical History:  Diagnosis Date  . Cancer ([place])     There are no active problems to display for this patient.   No past surgical history on file.  Current Outpatient Rx  . Order #: [id number] Class: Historical Med  . Order #: [id number] Class: Historical Med  . Order #: [id number] Class: Print  . Order #: [id number] Class: Historical Med  . Order #: [id number] Class: Historical Med    Allergies Bupropion; Flunisolide; Lisinopril; Mometasone; Terazosin; and Valsartan  No family history on file.  Social History Social History  Substance Use Topics  . Smoking status: Never Smoker  . Smokeless tobacco: Never Used  . Alcohol use No    Review of Systems Constitutional: No fever/chills.No lightheadedness or syncope. Positive generalized weakness. Positive inability to walk due to generalized  weakness. Eyes: No visual changes. No blurred or double vision. ENT: No sore throat. No congestion or rhinorrhea. Cardiovascular: Denies chest pain. Denies palpitations. Respiratory: Denies shortness of breath.  No cough. Gastrointestinal: Positive left-sided abdominal pain.  No nausea, no vomiting.  No diarrhea.  No constipation. Last bowel movement was yesterday and it was normal. No melena or blood in the stool. Genitourinary: Negative for dysuria. No hematuria. Musculoskeletal: Negative for back pain. Skin: Negative for rash. Neurological: Negative for headaches. No focal numbness, tingling or weakness. No imbalance with walking.  10-point ROS otherwise negative.  ____________________________________________   PHYSICAL EXAM:  VITAL SIGNS: ED Triage Vitals [01/18/16 1553]  Enc Vitals Group     BP 117/65     Pulse Rate 85     Resp 16     Temp 97.4 F (36.3 C)     Temp Source Oral     SpO2 96 %     Weight 200 lb (90.7 kg)     Height 6\' 4"  (1.93 m)     Head Circumference      Peak Flow      Pain Score 10     Pain Loc      Pain Edu?      Excl. in [place]?     Constitutional: Patient is alert and oriented and answers questions appropriately. He is chronically ill appearing but in no acute distress.  Eyes: Conjunctivae are normal.  EOMI. No scleral icterus. Head: Atraumatic. Nose: No congestion/rhinnorhea. Mouth/Throat: Mucous membranes are very dry.  Neck: No stridor.  Supple.  No JVD. No meningismus. Cardiovascular: Normal rate, regular rhythm. No murmurs, rubs or gallops.  Respiratory: Normal respiratory effort.  No accessory muscle use or retractions. Lungs CTAB.  No wheezes, rales or ronchi. Gastrointestinal: Soft, nontender and nondistended. I'm unable to reproduce the patient's pain. No guarding or rebound.  No peritoneal signs. Musculoskeletal: No LE edema.  Neurologic:  A&Ox3.  Speech is clear.  Face and smile are symmetric.  EOMI.  Moves all extremities well. Skin:   Skin is warm, dry and intact. No rash noted. Positive jaundice. Psychiatric: Flat affect  ____________________________________________   LABS (all labs ordered are listed, but only abnormal results are displayed)  Labs Reviewed  BASIC METABOLIC PANEL - Abnormal; Notable for the following:       Result Value   Potassium 2.9 (*)    Chloride 87 (*)    CO2 36 (*)    Glucose, Bld 125 (*)    BUN 22 (*)    Calcium 14.8 (*)    All other components within normal limits  CBC - Abnormal; Notable for the following:    WBC 18.5 (*)    Hemoglobin 12.4 (*)    HCT 36.8 (*)    RDW 15.2 (*)    All other components within normal limits  URINALYSIS COMPLETEWITH MICROSCOPIC ([facility] ONLY) - Abnormal; Notable for the following:    Color, Urine YELLOW (*)    APPearance CLEAR (*)    Squamous Epithelial / LPF 0-5 (*)    All other components within normal limits  HEPATIC FUNCTION PANEL - Abnormal; Notable for the following:    Albumin 2.5 (*)    ALT 10 (*)    All other components within normal limits  CULTURE, BLOOD (ROUTINE X 2)  CULTURE, BLOOD (ROUTINE X 2)  URINE CULTURE  AMMONIA  TSH  TROPONIN I   ____________________________________________  EKG  ED ECG REPORT I, Eula Listen, the attending physician, personally viewed and interpreted this ECG.   Date: 01/18/2016  EKG Time: 1556  Rate: 87  Rhythm: normal sinus rhythm  Axis: leftward  Intervals:none  ST&T Change: Nonspecific T-wave inversions in V1 and V2. No ST elevation.  ____________________________________________  RADIOLOGY  Dg Chest 2 View  Result Date: 01/18/2016 CLINICAL DATA:  Pt says he has been weak and unable to walk and be steady on his feet for about 3 weeks. Hx of asthma. Former smoker EXAM: CHEST  2 VIEW COMPARISON:  12/21/2015 FINDINGS: Heart size is normal. There is patchy infiltrate in the lingula consistent with infectious process. No pulmonary edema. No pleural effusions. Mild bronchitic changes are  present. IMPRESSION: 1. Bronchitic changes. 2. Infiltrate in the lingula. Electronically Signed   By: Nolon Nations M.D.   On: 01/18/2016 19:31   Ct Head Wo Contrast  Result Date: 01/18/2016 CLINICAL DATA:  75 year old male with abdominal pain, large abdominal tumor, increased weakness. Falls and inability to walk. Initial encounter. EXAM: CT HEAD WITHOUT CONTRAST TECHNIQUE: Contiguous axial images were obtained from the base of the skull through the vertex without intravenous contrast. COMPARISON:  CT Abdomen and Pelvis from today reported separately. 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 No midline shift, ventriculomegaly, mass effect, parenchymal mass lesion, intracranial hemorrhage or evidence of cortically based acute infarction. Gray-white matter differentiation is within normal limits throughout the brain. Cerebral volume is within normal limits for age. Vascular: Calcified atherosclerosis at the skull base. Skull: No osseous abnormality identified. Sinuses/Orbits: Visualized paranasal sinuses and mastoids are well pneumatized. Right maxillary periosteal thickening. Other: No acute orbit or scalp  soft tissue finding. IMPRESSION: Essentially normal for age noncontrast CT appearance of the brain. Note that early metastatic disease to the brain is difficult to exclude in the absence of intravenous contrast, and Brain MRI without and with contrast would best stage the brain as necessary. Electronically Signed   By: Genevie Ann M.D.   On: 01/18/2016 20:40   Ct Abdomen Pelvis W Contrast  Addendum Date: 01/18/2016   ADDENDUM REPORT: 01/18/2016 20:46 ADDENDUM: Study discussed by telephone with Dr. Eula Listen on 01/18/2016 at 20:45 . Electronically Signed   By: Genevie Ann M.D.   On: 01/18/2016 20:46   Result Date: 01/18/2016 CLINICAL DATA:  75 year old male with bilateral lower abdominal pain. Increased weakness. Falls.  Inability to walk. Initial encounter. EXAM: CT ABDOMEN AND PELVIS WITH CONTRAST TECHNIQUE: Multidetector CT imaging of the abdomen and pelvis was performed using the standard protocol following bolus administration of intravenous contrast. CONTRAST:  141mL ISOVUE-300 IOPAMIDOL (ISOVUE-300) INJECTION 61% COMPARISON:  Chest and abdominal radiographic series 10517. FINDINGS: Lower chest: No pericardial or pleural effusion. Lower lobe subpleural reticular opacity. No upper abdominal free air. Hepatobiliary: Negative liver. Surgically absent gallbladder. No intra or extrahepatic biliary duct enlargement. Pancreas/Spleen: There is a large heterogeneously enhancing solid mass occupying the left upper quadrant and lesser sac inseparable from the spleen, the proximal stomach, left adrenal gland, and the pancreatic body and tail. The mass encompasses 19.7 x 19.6 x 16 cm (AP by transverse by CC). The splenic flexure appears to be inferiorly displaced along the anterior and caudal aspect of the large tumor. There is mass effect on the proximal gastric lumen. The pancreatic head and uncinate process are unaffected and appear normal. There is superimposed extensive gastrohepatic ligament, retrocrural, and celiac axis lymphadenopathy, with individual nodal enlargement up to 3.6 cm short axis. New numerous small but increased in number mesenteric nodes throughout the root of the mesenteric. Adrenals/Urinary Tract: The left adrenal gland is involved as above. The right adrenal gland is normal. Bilateral renal enhancement and post contrast excretion is within normal limits. Stomach/Bowel: Abnormal proximal stomach as above. Oral contrast was administered and has reached the distal small bowel, but not yet reached the terminal ileum. The duodenum and proximal small bowel loops are not directly involved by the large tumor. As stated above, the splenic flexure appears displaced and not directly involved. There is widespread  diverticulosis of the colon. The large bowel is largely decompressed. No dilated small bowel. Vascular/Lymphatic: Large up to 6.4 cm diameter infrarenal abdominal aortic aneurysm with prominent mural plaque or thrombus. See series 2, image 54 and sagittal image 64. Calcified aortic atherosclerosis. Major arterial structures in the abdomen and pelvis remain patent. 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 The portal venous system is patent. Extensive upper abdominal lymphadenopathy as stated above. Reproductive: Penile implant. Other: Small volume pelvic free fluid. Unremarkable urinary bladder. Musculoskeletal: No acute or suspicious osseous lesion identified. IMPRESSION: 1. Large, nearly 20 cm heterogeneously enhancing and partially cystic or necrotic soft tissue mass inseparable from the proximal stomach, spleen, body and tail of the pancreas, and left adrenal gland. Extensive upper abdominal and retrocrural lymphadenopathy. No evidence of liver metastatic disease or biliary obstruction. Top differential considerations include Lymphoma, GIST, other gastric carcinoma, other primary splenic malignancy, and less likely pancreatic carcinoma. 2. Large up to 6.4 cm infrarenal abdominal aortic aneurysm. No evidence of acute rupture. Vascular surgery consultation recommended due to increased risk of rupture for AAA >5.5 cm. This recommendation  follows ACR consensus guidelines: White Paper of the ACR Incidental Findings Committee II on Vascular Findings. J Am Coll Radiol 2013; 10:789-794. 3. 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 Electronically Signed: By: Genevie Ann M.D. On: 01/18/2016 20:37    ____________________________________________   PROCEDURES  Procedure(s) performed: None  Procedures  Critical Care performed:  No ____________________________________________   INITIAL IMPRESSION / ASSESSMENT AND PLAN / ED COURSE  Pertinent labs & imaging results that were available during my care of the patient were reviewed by me and considered in my medical decision making (see chart for details).  75 y.o. male with 4 months of left-sided abdominal pain, presenting with generalized weakness and the inability to walk due to that. Overall, the patient has reassuring vital signs. I do not see any focal neurologic abnormalities on examination. He has no cardiopulmonary abnormalities other then an oxygen saturation which remains between 90-94%, likely due to his baseline COPD. From triage labs, the patient does have a potassium of 2.9, and a calcium of 14.8, which is higher than his last calcium at 12.0 on 10/05. He also has a white blood cell elevation to 18.5. I have added a troponin and a TSH to his laboratory studies and I am awaiting a urinalysis. We'll also get a CT of the head given his report of altered mental status, as well as pneumonia and hepatic function panel. We will get a CT of the abdomen for further evaluation. I anticipate admission for this patient.  ----------------------------------------- 9:03 PM on 01/18/2016 -----------------------------------------  On chest x-ray, the patient had an infiltrate in the lingula. He has not reported a cough or fever, but does have a white blood cell count of 18, and during his stay in the emergency department developed hypoxia to the low 80s with a good tracing on room air. He was placed on 2 L nasal cannula with a good response and oxygenation up to the mid and high 90s. I have gotten cultures from this patient and treated him with vancomycin and Zosyn.  I was called by the radiologist with multiple abnormal findings on the CT abdomen. The patient has a 20 cm intra-abdominal mass that is attached to multiple intra-abdominal organs. In addition he has a 6.5 cm AAA. I  have given the patient his results, and admitted him to the hospital for further evaluation and treatment.  ____________________________________________  FINAL CLINICAL IMPRESSION(S) / ED DIAGNOSES  Final diagnoses:  Hypokalemia  Hypercalcemia  Generalized abdominal mass  Abdominal aortic aneurysm (AAA) without rupture ([facility])  Aspiration pneumonia of left lung, unspecified aspiration pneumonia type, unspecified part of lung ([place])  Hypoxia    Clinical Course      NEW MEDICATIONS STARTED DURING THIS VISIT:  New Prescriptions   No medications on file      Eula Listen, MD 01/18/16 2105

## 2016-01-18 NOTE — ED Triage Notes (Signed)
Pt reports lower abdominal pain with possible constipation x1 week. Pt reports increased weakness, falling and decreased appetite.

## 2016-01-19 DIAGNOSIS — R590 Localized enlarged lymph nodes: Secondary | ICD-10-CM

## 2016-01-19 DIAGNOSIS — D709 Neutropenia, unspecified: Secondary | ICD-10-CM

## 2016-01-19 DIAGNOSIS — I10 Essential (primary) hypertension: Secondary | ICD-10-CM

## 2016-01-19 DIAGNOSIS — E279 Disorder of adrenal gland, unspecified: Secondary | ICD-10-CM

## 2016-01-19 DIAGNOSIS — E860 Dehydration: Secondary | ICD-10-CM

## 2016-01-19 DIAGNOSIS — R531 Weakness: Secondary | ICD-10-CM

## 2016-01-19 DIAGNOSIS — R451 Restlessness and agitation: Secondary | ICD-10-CM

## 2016-01-19 DIAGNOSIS — E43 Unspecified severe protein-calorie malnutrition: Secondary | ICD-10-CM | POA: Insufficient documentation

## 2016-01-19 DIAGNOSIS — R634 Abnormal weight loss: Secondary | ICD-10-CM

## 2016-01-19 DIAGNOSIS — I714 Abdominal aortic aneurysm, without rupture: Secondary | ICD-10-CM

## 2016-01-19 DIAGNOSIS — K869 Disease of pancreas, unspecified: Secondary | ICD-10-CM

## 2016-01-19 LAB — CBC
HEMATOCRIT: 38.6 % — AB (ref 40.0–52.0)
HEMOGLOBIN: 12.6 g/dL — AB (ref 13.0–18.0)
MCH: 28 pg (ref 26.0–34.0)
MCHC: 32.7 g/dL (ref 32.0–36.0)
MCV: 85.5 fL (ref 80.0–100.0)
Platelets: 358 10*3/uL (ref 150–440)
RBC: 4.52 MIL/uL (ref 4.40–5.90)
RDW: 15 % — ABNORMAL HIGH (ref 11.5–14.5)
WBC: 17.2 10*3/uL — AB (ref 3.8–10.6)

## 2016-01-19 LAB — BASIC METABOLIC PANEL
ANION GAP: 9 (ref 5–15)
BUN: 21 mg/dL — ABNORMAL HIGH (ref 6–20)
CALCIUM: 13.5 mg/dL — AB (ref 8.9–10.3)
CO2: 38 mmol/L — ABNORMAL HIGH (ref 22–32)
Chloride: 92 mmol/L — ABNORMAL LOW (ref 101–111)
Creatinine, Ser: 0.92 mg/dL (ref 0.61–1.24)
GLUCOSE: 76 mg/dL (ref 65–99)
POTASSIUM: 3.1 mmol/L — AB (ref 3.5–5.1)
SODIUM: 139 mmol/L (ref 135–145)

## 2016-01-19 LAB — LACTATE DEHYDROGENASE: LDH: 418 U/L — AB (ref 98–192)

## 2016-01-19 LAB — DIFFERENTIAL
BASOS ABS: 0.2 10*3/uL — AB (ref 0–0.1)
Basophils Relative: 1 %
EOS ABS: 0.2 10*3/uL (ref 0–0.7)
EOS PCT: 1 %
LYMPHS ABS: 1.7 10*3/uL (ref 1.0–3.6)
LYMPHS PCT: 10 %
Monocytes Absolute: 2.5 10*3/uL — ABNORMAL HIGH (ref 0.2–1.0)
Monocytes Relative: 15 %
NEUTROS PCT: 74 %
Neutro Abs: 12.5 10*3/uL — ABNORMAL HIGH (ref 1.4–6.5)

## 2016-01-19 LAB — AMMONIA

## 2016-01-19 MED ORDER — SODIUM CHLORIDE 0.9 % IV SOLN
4.0000 mg | Freq: Once | INTRAVENOUS | Status: AC
  Administered 2016-01-19: 4 mg via INTRAVENOUS
  Filled 2016-01-19: qty 5

## 2016-01-19 MED ORDER — POTASSIUM CHLORIDE CRYS ER 20 MEQ PO TBCR
40.0000 meq | EXTENDED_RELEASE_TABLET | Freq: Once | ORAL | Status: AC
  Administered 2016-01-19: 01:00:00 40 meq via ORAL
  Filled 2016-01-19: qty 2

## 2016-01-19 MED ORDER — MORPHINE SULFATE (PF) 2 MG/ML IV SOLN
2.0000 mg | INTRAVENOUS | Status: DC | PRN
  Administered 2016-01-20 – 2016-01-23 (×6): 2 mg via INTRAVENOUS
  Filled 2016-01-19 (×6): qty 1

## 2016-01-19 MED ORDER — ORAL CARE MOUTH RINSE
15.0000 mL | Freq: Two times a day (BID) | OROMUCOSAL | Status: DC
  Administered 2016-01-19 – 2016-01-30 (×19): 15 mL via OROMUCOSAL

## 2016-01-19 MED ORDER — MORPHINE SULFATE (PF) 4 MG/ML IV SOLN
4.0000 mg | INTRAVENOUS | Status: DC | PRN
  Administered 2016-01-19 (×2): 4 mg via INTRAVENOUS
  Filled 2016-01-19 (×2): qty 1

## 2016-01-19 MED ORDER — ENSURE ENLIVE PO LIQD
237.0000 mL | Freq: Three times a day (TID) | ORAL | Status: DC
  Administered 2016-01-19 – 2016-01-30 (×28): 237 mL via ORAL

## 2016-01-19 NOTE — Progress Notes (Addendum)
[facility] NAME: Brian Hughes    MR#:  [id number]  DATE OF BIRTH:  1940-08-26  SUBJECTIVE:  CHIEF COMPLAINT:   Chief Complaint  Patient presents with  . Weakness   - Complains of weakness, constipation and abdominal pain. -Also reports weight loss over the last 4 months.  REVIEW OF SYSTEMS:  Review of Systems  Constitutional: Positive for malaise/fatigue and weight loss. Negative for chills and fever.  HENT: Negative for ear discharge, ear pain, nosebleeds and tinnitus.   Eyes: Negative for blurred vision.  Respiratory: Negative for cough, shortness of breath and wheezing.   Cardiovascular: Negative for chest pain, palpitations and leg swelling.  Gastrointestinal: Positive for abdominal pain and constipation. Negative for diarrhea, nausea and vomiting.  Genitourinary: Negative for dysuria and urgency.  Musculoskeletal: Positive for joint pain. Negative for myalgias.  Skin: Negative for rash.  Neurological: Negative for dizziness, sensory change, speech change, focal weakness, seizures and headaches.  Psychiatric/Behavioral: Negative for depression.    DRUG ALLERGIES:   Allergies  Allergen Reactions  . Bupropion Nausea Only and Nausea And Vomiting  . Flunisolide     Other reaction(s): Other (See Comments) Wheezing  . Lisinopril Cough  . Mometasone     Other reaction(s): Other (See Comments) Wheezing  . Terazosin     Other reaction(s): Other (See Comments) Fatigue  . Valsartan Cough    VITALS:  Blood pressure (!) 142/66, pulse 74, temperature 97.9 F (36.6 C), temperature source Oral, resp. rate 18, height 6\' 4"  (1.93 m), weight 93.2 kg (205 lb 8 oz), SpO2 98 %.  PHYSICAL EXAMINATION:  Physical Exam  GENERAL:  75 y.o.-year-old patient lying in the bed with no acute distress. Disheleved appearing EYES: Pupils equal, round, reactive to light and accommodation. No scleral icterus. Extraocular muscles  intact.  HEENT: Head atraumatic, normocephalic. Oropharynx and nasopharynx clear.  NECK:  Supple, no jugular venous distention. No thyroid enlargement, no tenderness.  LUNGS: Normal breath sounds bilaterally, no wheezing, rales,rhonchi or crepitation. No use of accessory muscles of respiration. Diminished at the bases CARDIOVASCULAR: S1, S2 normal. No murmurs, rubs, or gallops.  ABDOMEN: Soft, some tenderness in LUQ, nondistended. Bowel sounds present. No organomegaly or mass.  EXTREMITIES: No pedal edema, cyanosis, or clubbing.  NEUROLOGIC: Cranial nerves II through XII are intact. Muscle strength 5/5 in all extremities. Sensation intact. Gait not checked. Global weakness noted. PSYCHIATRIC: The patient is alert and oriented x 3.  SKIN: No obvious rash, lesion, or ulcer.    LABORATORY PANEL:   CBC  Recent Labs Lab 01/19/16 0332  WBC 17.2*  HGB 12.6*  HCT 38.6*  PLT 358   ------------------------------------------------------------------------------------------------------------------  Chemistries   Recent Labs Lab 01/18/16 1911 01/19/16 0332  NA  --  139  K  --  3.1*  CL  --  92*  CO2  --  38*  GLUCOSE  --  76  BUN  --  21*  CREATININE  --  0.92  CALCIUM  --  13.5*  MG 1.7  --   AST 23  --   ALT 10*  --   ALKPHOS 72  --   BILITOT 0.9  --    ------------------------------------------------------------------------------------------------------------------  Cardiac Enzymes  Recent Labs Lab 01/18/16 1555  TROPONINI <0.03   ------------------------------------------------------------------------------------------------------------------  RADIOLOGY:  Dg Chest 2 View  Result Date: 01/18/2016 CLINICAL DATA:  Pt says he has been weak and unable to walk and be steady on his feet  for about 3 weeks. Hx of asthma. Former smoker EXAM: CHEST  2 VIEW COMPARISON:  12/21/2015 FINDINGS: Heart size is normal. There is patchy infiltrate in the lingula consistent with  infectious process. No pulmonary edema. No pleural effusions. Mild bronchitic changes are present. IMPRESSION: 1. Bronchitic changes. 2. Infiltrate in the lingula. Electronically Signed   By: Nolon Nations M.D.   On: 01/18/2016 19:31   Ct Head Wo Contrast  Result Date: 01/18/2016 CLINICAL DATA:  75 year old male with abdominal pain, large abdominal tumor, increased weakness. Falls and inability to walk. Initial encounter. EXAM: CT HEAD WITHOUT CONTRAST TECHNIQUE: Contiguous axial images were obtained from the base of the skull through the vertex without intravenous contrast. COMPARISON:  CT Abdomen and Pelvis from today reported separately. 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 No midline shift, ventriculomegaly, mass effect, parenchymal mass lesion, intracranial hemorrhage or evidence of cortically based acute infarction. Gray-white matter differentiation is within normal limits throughout the brain. Cerebral volume is within normal limits for age. Vascular: Calcified atherosclerosis at the skull base. Skull: No osseous abnormality identified. Sinuses/Orbits: Visualized paranasal sinuses and mastoids are well pneumatized. Right maxillary periosteal thickening. Other: No acute orbit or scalp soft tissue finding. IMPRESSION: Essentially normal for age noncontrast CT appearance of the brain. Note that early metastatic disease to the brain is difficult to exclude in the absence of intravenous contrast, and Brain MRI without and with contrast would best stage the brain as necessary. Electronically Signed   By: Genevie Ann M.D.   On: 01/18/2016 20:40   Ct Abdomen Pelvis W Contrast  Addendum Date: 01/18/2016   ADDENDUM REPORT: 01/18/2016 20:46 ADDENDUM: Study discussed by telephone with Dr. Eula Listen on 01/18/2016 at 20:45 . Electronically Signed   By: Genevie Ann M.D.   On: 01/18/2016 20:46   Result Date: 01/18/2016 CLINICAL  DATA:  75 year old male with bilateral lower abdominal pain. Increased weakness. Falls. Inability to walk. Initial encounter. EXAM: CT ABDOMEN AND PELVIS WITH CONTRAST TECHNIQUE: Multidetector CT imaging of the abdomen and pelvis was performed using the standard protocol following bolus administration of intravenous contrast. CONTRAST:  170mL ISOVUE-300 IOPAMIDOL (ISOVUE-300) INJECTION 61% COMPARISON:  Chest and abdominal radiographic series 10517. FINDINGS: Lower chest: No pericardial or pleural effusion. Lower lobe subpleural reticular opacity. No upper abdominal free air. Hepatobiliary: Negative liver. Surgically absent gallbladder. No intra or extrahepatic biliary duct enlargement. Pancreas/Spleen: There is a large heterogeneously enhancing solid mass occupying the left upper quadrant and lesser sac inseparable from the spleen, the proximal stomach, left adrenal gland, and the pancreatic body and tail. The mass encompasses 19.7 x 19.6 x 16 cm (AP by transverse by CC). The splenic flexure appears to be inferiorly displaced along the anterior and caudal aspect of the large tumor. There is mass effect on the proximal gastric lumen. The pancreatic head and uncinate process are unaffected and appear normal. There is superimposed extensive gastrohepatic ligament, retrocrural, and celiac axis lymphadenopathy, with individual nodal enlargement up to 3.6 cm short axis. New numerous small but increased in number mesenteric nodes throughout the root of the mesenteric. Adrenals/Urinary Tract: The left adrenal gland is involved as above. The right adrenal gland is normal. Bilateral renal enhancement and post contrast excretion is within normal limits. Stomach/Bowel: Abnormal proximal stomach as above. Oral contrast was administered and has reached the distal small bowel, but not yet reached the terminal ileum. The duodenum and proximal small bowel loops are not directly involved  by the large tumor. As stated above, the  splenic flexure appears displaced and not directly involved. There is widespread diverticulosis of the colon. The large bowel is largely decompressed. No dilated small bowel. Vascular/Lymphatic: Large up to 6.4 cm diameter infrarenal abdominal aortic aneurysm with prominent mural plaque or thrombus. See series 2, image 54 and sagittal image 64. Calcified aortic atherosclerosis. Major arterial structures in the abdomen and pelvis remain patent. 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 The portal venous system is patent. Extensive upper abdominal lymphadenopathy as stated above. Reproductive: Penile implant. Other: Small volume pelvic free fluid. Unremarkable urinary bladder. Musculoskeletal: No acute or suspicious osseous lesion identified. IMPRESSION: 1. Large, nearly 20 cm heterogeneously enhancing and partially cystic or necrotic soft tissue mass inseparable from the proximal stomach, spleen, body and tail of the pancreas, and left adrenal gland. Extensive upper abdominal and retrocrural lymphadenopathy. No evidence of liver metastatic disease or biliary obstruction. Top differential considerations include Lymphoma, GIST, other gastric carcinoma, other primary splenic malignancy, and less likely pancreatic carcinoma. 2. Large up to 6.4 cm infrarenal abdominal aortic aneurysm. No evidence of acute rupture. Vascular surgery consultation recommended due to increased risk of rupture for AAA >5.5 cm. This recommendation follows ACR consensus guidelines: White Paper of the ACR Incidental Findings Committee II on Vascular Findings. J Am Coll Radiol 2013; 10:789-794. 3. 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 Electronically Signed: By: Genevie Ann M.D. On: 01/18/2016 20:37    EKG:   Orders placed or performed during the hospital encounter of 01/18/16  . ED EKG  .  ED EKG  . EKG 12-Lead  . EKG 12-Lead    ASSESSMENT AND PLAN:   75 year old male with past medical history significant for asthma, hypertension presents to the hospital secondary to weight loss, abdominal pain and constipation going on for almost 4 months now.  #1 Hypercalcemia and hypokalemia-hypercalcemia is likely secondary to underlying malignancy. -Received IV hydration with slight improvement. Ordered Zometa -Follow-up and oncology consulted -Hypokalemia is being replaced appropriately  #2 abdominal mass-20 cm x 20 cm abdominal mass of unknown primary. Possible underlying malignancy -High suspicion for lymphoma. Oncology has been consulted. Likely might need a CT-guided biopsy -Further workup per oncology.  #3 AAA- infrarenal AAA of about 6.5cm noted with no rupture but has has mural plaque Vascular surgery consulted  #4 asthma/COPD-continue inhalers and nebulizers as needed  #5 Hypertension-continue lisinopril  #6 DVT prophylaxis-on Lovenox  #7 leukocytosis-no evidence of any infection noted. Could be from underlying malignancy. Check differential count   Physical therapy consulted for weakness     All the records are reviewed and case discussed with Care Management/Social Workerr. Management plans discussed with the patient, family and they are in agreement.  CODE STATUS: DNR -discussed With the patient and her home, Gerald Stabs, RN present  TOTAL TIME TAKING CARE OF THIS PATIENT: 41 minutes.   POSSIBLE D/C IN 3 DAYS, DEPENDING ON CLINICAL CONDITION.   Gladstone Lighter M.D on 01/19/2016 at 10:12 AM  Between 7am to 6pm - Pager - [phone]  After 6pm go to [email or web address] - password [email or web address] [facility]  Office  [phone]  CC: Primary care physician; No PCP Per Patient

## 2016-01-19 NOTE — Care Management (Signed)
Admitted to [facility] with the diagnosis of hypercalcemia. (13.5). Lives with son, Dory [place] [phone]). Primary care physician care is [facility].  Telephone conversation with Tammi Klippel PA at [facility]. Colonscopy is scheduled for 03/04/16. Weak x 2-3 days. No falls. Decreased appetite x 3 months. 40 pound weight loss.  20 cm mass revealed on CT of abdomen (bowel/spleen/pancreae) Takes care of all basic and instrumental activities of daily living himeself. Shelbie Ammons RN MSN CCM Care Management [phone]

## 2016-01-19 NOTE — Progress Notes (Signed)
Initial Nutrition Assessment  DOCUMENTATION CODES:   Severe malnutrition in context of acute illness/injury  INTERVENTION:  -Recommend Ensure Enlive po TID, each supplement provides 350 kcal and 20 grams of protein -Encourage small frequent meals   NUTRITION DIAGNOSIS:   Malnutrition related to acute illness as evidenced by percent weight loss, energy intake < or equal to 50% for > or equal to 5 days.    GOAL:   Patient will meet greater than or equal to 90% of their needs    MONITOR:   PO intake, Supplement acceptance, Weight trends  REASON FOR ASSESSMENT:   Malnutrition Screening Tool    ASSESSMENT:      75 y.o. Male admitted with weakness, abdominal pain, hypercalcemia.  Noted CT showed mass extending over bowel, spleen and pancreas, primary unknown, questionable lymphoma. Past medical history of asthma, HTN,   Pt reports 40 pound weight loss in the last 3 months but then pt reports in the last month.  Per wt encounters 6% wt loss in the last month.   Pt reports poor po intake for the last month. Drinking ensure TID and eating frozen meals per day.  Appetite has been decreased due to abdominal pain. Eating eggs and sausage this am.    Labs reviewed: K 3.1, BUN 21, creatinine WDL, WBC 17.2, calcium 13.5 Medications reviewed: colace, protonix, senna, carafate, NS with KCL at 135ml/hr  Nutrition-Focused physical exam completed. Findings are mild fat depletion, mild muscle depletion, and no edema.     Diet Order:  Diet regular Room service appropriate? Yes; Fluid consistency: Thin  Skin:  Reviewed, no issues  Last BM:  10/31  Height:   Ht Readings from Last 1 Encounters:  01/18/16 6\' 4"  (1.93 m)    Weight:   Wt Readings from Last 1 Encounters:  01/19/16 205 lb 8 oz (93.2 kg)    Ideal Body Weight:     BMI:  Body mass index is 25.01 kg/m.  Estimated Nutritional Needs:   Kcal:  [id number] kcals/d  Protein:  111-140 g/d  Fluid:  >/= 2.3  L/d  EDUCATION NEEDS:   No education needs identified at this time  Murl Golladay B. Zenia Resides, [place], [place] (pager) Weekend/On-Call pager [phone])

## 2016-01-19 NOTE — Care Management Important Message (Signed)
Important Message  Patient Details  Name: Brian Hughes MRN: [id number] Date of Birth: Sep 19, 1940   Medicare Important Message Given:  Yes    Shelbie Ammons, RN 01/19/2016, 8:30 AM

## 2016-01-19 NOTE — Consult Note (Signed)
[facility] Consult Note  MRN : [id number]  Rusell Choat is a 75 y.o. (07/07/1940) male who presents with chief complaint of  Chief Complaint  Patient presents with  . Weakness   History of Present Illness:  The patient is a 75 year old male with a past medical history of hypertension and asthma who endorses a history of worsening abdominal pain, weakness and 40 pound weight loss over the last three to four months who presented to [facility] on 01/18/16 "unable to walk" due to "being so weak". He denies any fever, nausea or vomiting. Experiencing some constipation. During work up in ED, CT was notable for a large, nearly 20 cm heterogeneously enhancing and partially cystic or necrotic soft tissue mass inseparable from the proximal stomach, spleen, body and tail of the pancreas, and left adrenal gland. Extensive upper abdominal and retrocrural lymphadenopathy with extensive upper abdominal and root of the mesentery metastatic nodal disease. He was also found to have a 6.4 cm infrarenal abdominal aortic aneurysm. Patient denies any pain radiating toward the back or lower extremity pain.   Vascular surgery was consulted by the patient primary team, Dr. Tressia Miners for recommendations in regard to repair.   Current Facility-Administered Medications  Medication Dose Route Frequency Provider Last Rate Last Dose  . 0.9 % NaCl with KCl 20 mEq/ L  infusion   Intravenous Continuous Hillary Bow, MD 100 mL/hr at 01/19/16 1158    . acetaminophen (TYLENOL) tablet 650 mg  650 mg Oral Q6H PRN Hillary Bow, MD       Or  . acetaminophen (TYLENOL) suppository 650 mg  650 mg Rectal Q6H PRN Srikar Sudini, MD      . albuterol (PROVENTIL) (2.5 MG/3ML) 0.083% nebulizer solution 2.5 mg  2.5 mg Nebulization Q2H PRN Srikar Sudini, MD      . bisacodyl (DULCOLAX) suppository 10 mg  10 mg Rectal Daily PRN Srikar Sudini, MD      . docusate sodium (COLACE) capsule 100 mg  100 mg Oral BID  Hillary Bow, MD   100 mg at 01/19/16 0903  . enoxaparin (LOVENOX) injection 40 mg  40 mg Subcutaneous Q24H Srikar Sudini, MD      . feeding supplement (ENSURE ENLIVE) (ENSURE ENLIVE) liquid 237 mL  237 mL Oral TID BM Gladstone Lighter, MD   237 mL at 01/19/16 1400  . HYDROcodone-acetaminophen (NORCO/VICODIN) 5-325 MG per tablet 1-2 tablet  1-2 tablet Oral Q4H PRN Hillary Bow, MD   1 tablet at 01/18/16 2329  . lisinopril (PRINIVIL,ZESTRIL) tablet 40 mg  40 mg Oral Daily Hillary Bow, MD   40 mg at 01/19/16 0903  . MEDLINE mouth rinse  15 mL Mouth Rinse BID Lance Coon, MD   15 mL at 01/19/16 1000  . morphine 4 MG/ML injection 4 mg  4 mg Intravenous Q4H PRN Lance Coon, MD   4 mg at 01/19/16 1317  . ondansetron (ZOFRAN) tablet 4 mg  4 mg Oral Q6H PRN Hillary Bow, MD       Or  . ondansetron (ZOFRAN) injection 4 mg  4 mg Intravenous Q6H PRN Srikar Sudini, MD      . pantoprazole (PROTONIX) EC tablet 40 mg  40 mg Oral Daily Hillary Bow, MD   40 mg at 01/19/16 0903  . polyethylene glycol (MIRALAX / GLYCOLAX) packet 17 g  17 g Oral Daily PRN Hillary Bow, MD      . senna (SENOKOT) tablet 8.6 mg  1 tablet Oral BID  Hillary Bow, MD   8.6 mg at 01/19/16 0903  . sodium chloride flush (NS) 0.9 % injection 3 mL  3 mL Intravenous Q12H Srikar Sudini, MD   3 mL at 01/19/16 1000  . sucralfate (CARAFATE) tablet 1 g  1 g Oral Daily Hillary Bow, MD   1 g at 01/19/16 [id number]   Past Medical History:  Diagnosis Date  . Asthma   . Cancer ([place])   . Hypertension    No past surgical history on file.  Social History Social History  Substance Use Topics  . Smoking status: Never Smoker  . Smokeless tobacco: Never Used  . Alcohol use No   Family History Family History  Problem Relation Age of Onset  . Jaundice Father   Denies family history of PAD, Aneurysmal or Renal Disease.   Allergies  Allergen Reactions  . Bupropion Nausea Only and Nausea And Vomiting  . Flunisolide     Other reaction(s):  Other (See Comments) Wheezing  . Lisinopril Cough  . Mometasone     Other reaction(s): Other (See Comments) Wheezing  . Terazosin     Other reaction(s): Other (See Comments) Fatigue  . Valsartan Cough   REVIEW OF SYSTEMS (Negative unless checked)  Constitutional: [x] Weight loss  [] Fever  [] Chills Cardiac: [] Chest pain   [] Chest pressure   [] Palpitations   [] Shortness of breath when laying flat   [] Shortness of breath at rest   [] Shortness of breath with exertion. Vascular:  [] Pain in legs with walking   [] Pain in legs at rest   [] Pain in legs when laying flat   [] Claudication   [] Pain in feet when walking  [] Pain in feet at rest  [] Pain in feet when laying flat   [] History of DVT   [] Phlebitis   [] Swelling in legs   [] Varicose veins   [] Non-healing ulcers Pulmonary:   [] Uses home oxygen   [] Productive cough   [] Hemoptysis   [x] Wheeze  [] COPD   [] Asthma Neurologic:  [] Dizziness  [] Blackouts   [] Seizures   [] History of stroke   [] History of TIA  [] Aphasia   [] Temporary blindness   [] Dysphagia   [x] Weakness or numbness in arms   [x] Weakness or numbness in legs Musculoskeletal:  [] Arthritis   [] Joint swelling   [] Joint pain   [] Low back pain Hematologic:  [] Easy bruising  [] Easy bleeding   [] Hypercoagulable state   [] Anemic  [] Hepatitis Gastrointestinal:  [] Blood in stool   [] Vomiting blood  [] Gastroesophageal reflux/heartburn   [] Difficulty swallowing. Genitourinary:  [] Chronic kidney disease   [] Difficult urination  [] Frequent urination  [] Burning with urination   [] Blood in urine Skin:  [] Rashes   [] Ulcers   [] Wounds Psychological:  [] History of anxiety   []  History of major depression.  Physical Examination  Vitals:   01/19/16 0407 01/19/16 0557 01/19/16 0834 01/19/16 1344  BP: (!) 101/44  (!) 142/66 (!) 127/52  Pulse: 71  74 82  Resp: 19  18 18   Temp: 98.5 F (36.9 C)  97.9 F (36.6 C) 97.8 F (36.6 C)  TempSrc: Oral  Oral   SpO2: 93%  98% 97%  Weight:  205 lb 8 oz (93.2 kg)     Height:       Body mass index is 25.01 kg/m. Gen:  WD/WN, NAD Head: [place]/AT, No temporalis wasting. Prominent temp pulse not noted. Ear/Nose/Throat: Hearing grossly intact, nares w/o erythema or drainage, oropharynx w/o Erythema/Exudate Eyes: Sclera non-icteric, conjunctiva clear Neck: Trachea midline.  No JVD.  Pulmonary:  Good air  movement, respirations not labored, equal bilaterally.  Cardiac: RRR, normal S1, S2. Vascular:  Vessel Right Left  Radial Palpable Palpable  Ulnar Palpable Palpable  Brachial Palpable Palpable  Carotid Palpable, without bruit Palpable, without bruit  Aorta Not palpable N/A  Femoral Palpable Palpable  Popliteal Palpable Palpable  PT Palpable Palpable  DP Palpable Palpable   Gastrointestinal: soft, non-distended. Some left sided tenderness with palpation. No guarding/reflex.  Musculoskeletal: M/S 5/5 throughout.  Extremities without ischemic changes.  No deformity or atrophy. No edema. Neurologic: Sensation grossly intact in extremities.  Symmetrical.  Speech is fluent. Motor exam as listed above. Psychiatric: Judgment intact, Mood & affect appropriate for pt's clinical situation. Dermatologic: No rashes or ulcers noted.  No cellulitis or open wounds. Lymph : No Cervical, Axillary, or Inguinal lymphadenopathy.  CBC Lab Results  Component Value Date   WBC 17.2 (H) 01/19/2016   HGB 12.6 (L) 01/19/2016   HCT 38.6 (L) 01/19/2016   MCV 85.5 01/19/2016   PLT 358 01/19/2016   BMET    Component Value Date/Time   NA 139 01/19/2016 0332   K 3.1 (L) 01/19/2016 0332   CL 92 (L) 01/19/2016 0332   CO2 38 (H) 01/19/2016 0332   GLUCOSE 76 01/19/2016 0332   BUN 21 (H) 01/19/2016 0332   CREATININE 0.92 01/19/2016 0332   CALCIUM 13.5 (HH) 01/19/2016 0332   GFRNONAA >60 01/19/2016 0332   GFRAA >60 01/19/2016 0332   Estimated Creatinine Clearance: 89.1 mL/min (by C-G formula based on SCr of 0.92 mg/dL).  COAG No results found for: INR,  PROTIME  Radiology Dg Chest 2 View  Result Date: 01/18/2016 CLINICAL DATA:  Pt says he has been weak and unable to walk and be steady on his feet for about 3 weeks. Hx of asthma. Former smoker EXAM: CHEST  2 VIEW COMPARISON:  12/21/2015 FINDINGS: Heart size is normal. There is patchy infiltrate in the lingula consistent with infectious process. No pulmonary edema. No pleural effusions. Mild bronchitic changes are present. IMPRESSION: 1. Bronchitic changes. 2. Infiltrate in the lingula. Electronically Signed   By: Nolon Nations M.D.   On: 01/18/2016 19:31   Ct Head Wo Contrast  Result Date: 01/18/2016 CLINICAL DATA:  75 year old male with abdominal pain, large abdominal tumor, increased weakness. Falls and inability to walk. Initial encounter. EXAM: CT HEAD WITHOUT CONTRAST TECHNIQUE: Contiguous axial images were obtained from the base of the skull through the vertex without intravenous contrast. COMPARISON:  CT Abdomen and Pelvis from today reported separately. 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 No midline shift, ventriculomegaly, mass effect, parenchymal mass lesion, intracranial hemorrhage or evidence of cortically based acute infarction. Gray-white matter differentiation is within normal limits throughout the brain. Cerebral volume is within normal limits for age. Vascular: Calcified atherosclerosis at the skull base. Skull: No osseous abnormality identified. Sinuses/Orbits: Visualized paranasal sinuses and mastoids are well pneumatized. Right maxillary periosteal thickening. Other: No acute orbit or scalp soft tissue finding. IMPRESSION: Essentially normal for age noncontrast CT appearance of the brain. Note that early metastatic disease to the brain is difficult to exclude in the absence of intravenous contrast, and Brain MRI without and with contrast would best stage the brain as necessary. Electronically Signed   By:  Genevie Ann M.D.   On: 01/18/2016 20:40   Ct Abdomen Pelvis W Contrast  Addendum Date: 01/18/2016   ADDENDUM REPORT: 01/18/2016 20:46 ADDENDUM: Study discussed by telephone with Dr. Eula Listen on  01/18/2016 at 20:45 . Electronically Signed   By: Genevie Ann M.D.   On: 01/18/2016 20:46   Result Date: 01/18/2016 CLINICAL DATA:  75 year old male with bilateral lower abdominal pain. Increased weakness. Falls. Inability to walk. Initial encounter. EXAM: CT ABDOMEN AND PELVIS WITH CONTRAST TECHNIQUE: Multidetector CT imaging of the abdomen and pelvis was performed using the standard protocol following bolus administration of intravenous contrast. CONTRAST:  192mL ISOVUE-300 IOPAMIDOL (ISOVUE-300) INJECTION 61% COMPARISON:  Chest and abdominal radiographic series 10517. FINDINGS: Lower chest: No pericardial or pleural effusion. Lower lobe subpleural reticular opacity. No upper abdominal free air. Hepatobiliary: Negative liver. Surgically absent gallbladder. No intra or extrahepatic biliary duct enlargement. Pancreas/Spleen: There is a large heterogeneously enhancing solid mass occupying the left upper quadrant and lesser sac inseparable from the spleen, the proximal stomach, left adrenal gland, and the pancreatic body and tail. The mass encompasses 19.7 x 19.6 x 16 cm (AP by transverse by CC). The splenic flexure appears to be inferiorly displaced along the anterior and caudal aspect of the large tumor. There is mass effect on the proximal gastric lumen. The pancreatic head and uncinate process are unaffected and appear normal. There is superimposed extensive gastrohepatic ligament, retrocrural, and celiac axis lymphadenopathy, with individual nodal enlargement up to 3.6 cm short axis. New numerous small but increased in number mesenteric nodes throughout the root of the mesenteric. Adrenals/Urinary Tract: The left adrenal gland is involved as above. The right adrenal gland is normal. Bilateral renal enhancement and  post contrast excretion is within normal limits. Stomach/Bowel: Abnormal proximal stomach as above. Oral contrast was administered and has reached the distal small bowel, but not yet reached the terminal ileum. The duodenum and proximal small bowel loops are not directly involved by the large tumor. As stated above, the splenic flexure appears displaced and not directly involved. There is widespread diverticulosis of the colon. The large bowel is largely decompressed. No dilated small bowel. Vascular/Lymphatic: Large up to 6.4 cm diameter infrarenal abdominal aortic aneurysm with prominent mural plaque or thrombus. See series 2, image 54 and sagittal image 64. Calcified aortic atherosclerosis. Major arterial structures in the abdomen and pelvis remain patent. 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 The portal venous system is patent. Extensive upper abdominal lymphadenopathy as stated above. Reproductive: Penile implant. Other: Small volume pelvic free fluid. Unremarkable urinary bladder. Musculoskeletal: No acute or suspicious osseous lesion identified. IMPRESSION: 1. Large, nearly 20 cm heterogeneously enhancing and partially cystic or necrotic soft tissue mass inseparable from the proximal stomach, spleen, body and tail of the pancreas, and left adrenal gland. Extensive upper abdominal and retrocrural lymphadenopathy. No evidence of liver metastatic disease or biliary obstruction. Top differential considerations include Lymphoma, GIST, other gastric carcinoma, other primary splenic malignancy, and less likely pancreatic carcinoma. 2. Large up to 6.4 cm infrarenal abdominal aortic aneurysm. No evidence of acute rupture. Vascular surgery consultation recommended due to increased risk of rupture for AAA >5.5 cm. This recommendation follows ACR consensus guidelines: White Paper of the ACR Incidental Findings Committee II on Vascular Findings. J Am Coll Radiol 2013; 10:789-794. 3.  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 Electronically Signed: By: Genevie Ann M.D. On: 01/18/2016 20:37   Dg Abdomen Acute W/chest  Result Date: 12/22/2015 CLINICAL DATA:  Abdominal pain, last bowel movement yesterday. EXAM: DG ABDOMEN ACUTE W/ 1V CHEST COMPARISON:  None. FINDINGS:  Cardiomediastinal silhouette is normal. Increased lung volumes with mild chronic bronchitic changes and bibasilar interstitial prominence. Lungs are clear, no pleural effusions. No pneumothorax. Soft tissue planes and included osseous structures are unremarkable. Bowel gas pattern is nondilated and nonobstructive. Surgical clips in the included right abdomen compatible with cholecystectomy. No intra-abdominal mass effect, pathologic calcifications or free air. Faint linear calcifications lateral to the lumbar spine. Soft tissue planes and included osseous structures are non-suspicious. IMPRESSION: COPD with bibasilar scarring/fibrosis. Normal bowel gas pattern. Possible infrarenal aortic calcified aneurysm. Electronically Signed   By: Elon Alas M.D.   On: 12/22/2015 00:23   Assessment/Plan The patient is a 76 year old male with a past medical history of hypertension and asthma who endorses a history of worsening abdominal pain, weakness and 40 pound weight loss over the last three to four months who presented to [facility] on 01/18/16 found to have abdominal mass suspicious for cancer and AAA measuring 6.4cm.  1. Infrarenal Abdominal Aortic Aneurysm: Measuring 6.4cm. CT images reviewed by Dr. Lucky Cowboy. Recommend endovascular repair. Discussed with patient. Procedure, risks and benefits explained to patient. Patient wishes to proceed. Will plan as an outpatient procedure on 01/31/16. Patient will need cardiac clearance. Can be obtained while he is inpatient.  2. Hypertension: Encouraged tight control as this  directly effects the growth and increases risk of leak / rupture of aneurysm.  3. Possible Abdominal Malignancy - Would repair AAA before any kind of surgical intervention.   Discussed with Dr. Mayme Genta, PA-C  01/19/2016 5:44 PM

## 2016-01-19 NOTE — Evaluation (Signed)
Physical Therapy Evaluation Patient Details Name: Brian Hughes MRN: [id number] DOB: 1940/08/21 Today's Date: 01/19/2016   History of Present Illness  Pt admitted for complaints of weakness and abdominal pain. Per chart review, pt recently lost 40lbs in last 3 months. Pt with history of ashtma, HTN, and cancer. Pt with large AAA which appears to be stable at this time per MD. 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 Pt is very poor historian secondary to confusion..  Clinical Impression  Pt is a pleasantly confused 75 year old male who was admitted for hypercalemia. Pt also with complaints of weakness and abdominal pain. Pt performs bed mobility with mod assist and needs constant assist to maintain seated position. Pt with jerking episodes noted during seated balance. Assist for urinal use. Pt insists he was able to walk to bathroom this AM. Unsure of accurate history or change in status. Attempted to call RN, however unavailable, RN aide noted. Pt demonstrates deficits with strength/cognition/endurance. Pt is currently not safe to dc home and is not at baseline level. Would benefit from skilled PT to address above deficits and promote optimal return to PLOF; recommend transition to STR upon discharge from acute hospitalization.       Follow Up Recommendations SNF    Equipment Recommendations       Recommendations for Other Services       Precautions / Restrictions Precautions Precautions: Fall Restrictions Weight Bearing Restrictions: No      Mobility  Bed Mobility Overal bed mobility: Needs Assistance Bed Mobility: Supine to Sit     Supine to sit: Mod assist     General bed mobility comments: assist for trunk stability and sitting at EOB. Pt very unsteady with heavy post leaning noted while seated. Pt also with visible jerking episodes. Pt able to perform sit->supine with cga.  Transfers                 General transfer comment: unable at this time secondary to  poor sitting balance/confusion/lethargy  Ambulation/Gait                Stairs            Wheelchair Mobility    Modified Rankin (Stroke Patients Only)       Balance Overall balance assessment: History of Falls;Needs assistance (reports multiple falls at home) Sitting-balance support: Feet supported Sitting balance-Leahy Scale: Poor                                       Pertinent Vitals/Pain Pain Assessment: Faces Faces Pain Scale: Hurts little more Pain Location: stomach Pain Descriptors / Indicators: Discomfort;Dull Pain Intervention(s): Limited activity within patient's tolerance    Home Living Family/patient expects to be discharged to:: Private residence Living Arrangements: Children Available Help at Discharge:  (son provides 24/7 assist per pt, however he is poor historia) Type of Home: House Home Access: Level entry     Home Layout: One level Home Equipment:  (unsure of home equipment as pt is confused)      Prior Function Level of Independence: Independent         Comments: per patient, he was able to ambulate "just fine" prior to admission. Does not answer when asked if he used AD     Hand Dominance        Extremity/Trunk Assessment   Upper Extremity Assessment: Generalized weakness (B UE grossly 4/5)  Lower Extremity Assessment: Generalized weakness (B LE grossly 3/5; unsure of great effort provided)         Communication   Communication: No difficulties  Cognition Arousal/Alertness: Lethargic Behavior During Therapy: WFL for tasks assessed/performed Overall Cognitive Status: Impaired/Different from baseline                      General Comments      Exercises Other Exercises Other Exercises: Pt asking to ambulate to bathroom. Not safe at this time. Assisted pt to peform bed mobility again and sit at EOB. Pt given urinal to use and pt subsequently dumped out on floor. Max assist  required for holding urinal and maintaining seated balance in order to urinate at bedside. Assist for hygiene required   Assessment/Plan    PT Assessment Patient needs continued PT services  PT Problem List Decreased strength;Decreased activity tolerance;Decreased balance;Decreased mobility;Decreased knowledge of use of DME;Decreased safety awareness;Decreased cognition          PT Treatment Interventions Gait training;Therapeutic exercise;DME instruction    PT Goals (Current goals can be found in the Care Plan section)  Acute Rehab PT Goals Patient Stated Goal: unable to state PT Goal Formulation: Patient unable to participate in goal setting Time For Goal Achievement: 02/02/16 Potential to Achieve Goals: Fair    Frequency Min 2X/week   Barriers to discharge Inaccessible home environment      Co-evaluation               End of Session Equipment Utilized During Treatment: Oxygen (pt trying to take O2 off during session) Activity Tolerance: Patient limited by lethargy;Patient limited by pain Patient left: in bed;with bed alarm set Nurse Communication: Mobility status         Time: [id number] PT Time Calculation (min) (ACUTE ONLY): 20 min   Charges:   PT Evaluation $PT Eval Moderate Complexity: 1 Procedure PT Treatments $Therapeutic Activity: 8-22 mins   PT G Codes:        Brian Hughes 01-23-2016, 3:22 PM  Brian Hughes, PT, DPT [phone]

## 2016-01-20 DIAGNOSIS — R410 Disorientation, unspecified: Secondary | ICD-10-CM

## 2016-01-20 DIAGNOSIS — R1900 Intra-abdominal and pelvic swelling, mass and lump, unspecified site: Secondary | ICD-10-CM

## 2016-01-20 DIAGNOSIS — R32 Unspecified urinary incontinence: Secondary | ICD-10-CM

## 2016-01-20 LAB — CBC WITH DIFFERENTIAL/PLATELET
BASOS ABS: 0 10*3/uL (ref 0–0.1)
BASOS PCT: 0 %
EOS ABS: 0.1 10*3/uL (ref 0–0.7)
EOS PCT: 0 %
HCT: 36.8 % — ABNORMAL LOW (ref 40.0–52.0)
HEMOGLOBIN: 12.2 g/dL — AB (ref 13.0–18.0)
LYMPHS ABS: 0.6 10*3/uL — AB (ref 1.0–3.6)
Lymphocytes Relative: 3 %
MCH: 28.2 pg (ref 26.0–34.0)
MCHC: 33.1 g/dL (ref 32.0–36.0)
MCV: 85.1 fL (ref 80.0–100.0)
Monocytes Absolute: 1.4 10*3/uL — ABNORMAL HIGH (ref 0.2–1.0)
Monocytes Relative: 7 %
NEUTROS PCT: 90 %
Neutro Abs: 18.9 10*3/uL — ABNORMAL HIGH (ref 1.4–6.5)
PLATELETS: 395 10*3/uL (ref 150–440)
RBC: 4.33 MIL/uL — AB (ref 4.40–5.90)
RDW: 15 % — ABNORMAL HIGH (ref 11.5–14.5)
WBC: 21 10*3/uL — AB (ref 3.8–10.6)

## 2016-01-20 LAB — BASIC METABOLIC PANEL
Anion gap: 7 (ref 5–15)
BUN: 24 mg/dL — AB (ref 6–20)
CHLORIDE: 98 mmol/L — AB (ref 101–111)
CO2: 36 mmol/L — ABNORMAL HIGH (ref 22–32)
CREATININE: 0.95 mg/dL (ref 0.61–1.24)
Calcium: 14.3 mg/dL (ref 8.9–10.3)
Glucose, Bld: 100 mg/dL — ABNORMAL HIGH (ref 65–99)
POTASSIUM: 3.6 mmol/L (ref 3.5–5.1)
SODIUM: 141 mmol/L (ref 135–145)

## 2016-01-20 LAB — MRSA PCR SCREENING: MRSA BY PCR: NEGATIVE

## 2016-01-20 LAB — URIC ACID: Uric Acid, Serum: 8.4 mg/dL — ABNORMAL HIGH (ref 4.4–7.6)

## 2016-01-20 LAB — URINE CULTURE

## 2016-01-20 LAB — GLUCOSE, CAPILLARY: GLUCOSE-CAPILLARY: 86 mg/dL (ref 65–99)

## 2016-01-20 MED ORDER — CEFTRIAXONE SODIUM-DEXTROSE 1-3.74 GM-% IV SOLR
1.0000 g | INTRAVENOUS | Status: DC
  Administered 2016-01-21 – 2016-01-23 (×3): 1 g via INTRAVENOUS
  Filled 2016-01-20 (×3): qty 50

## 2016-01-20 MED ORDER — CEFTRIAXONE SODIUM-DEXTROSE 1-3.74 GM-% IV SOLR
1.0000 g | Freq: Once | INTRAVENOUS | Status: AC
  Administered 2016-01-20: 08:00:00 1 g via INTRAVENOUS
  Filled 2016-01-20: qty 50

## 2016-01-20 MED ORDER — CALCITONIN (SALMON) 200 UNIT/ACT NA SOLN
1.0000 | Freq: Every day | NASAL | Status: DC
  Administered 2016-01-20 – 2016-01-25 (×5): 1 via NASAL
  Filled 2016-01-20: qty 3.7

## 2016-01-20 MED ORDER — AZITHROMYCIN 500 MG PO TABS
500.0000 mg | ORAL_TABLET | Freq: Every day | ORAL | Status: DC
  Administered 2016-01-20 – 2016-01-23 (×4): 500 mg via ORAL
  Filled 2016-01-20 (×6): qty 1

## 2016-01-20 MED ORDER — DEXAMETHASONE SODIUM PHOSPHATE 4 MG/ML IJ SOLN
8.0000 mg | Freq: Once | INTRAMUSCULAR | Status: AC
  Administered 2016-01-20: 8 mg via INTRAVENOUS
  Filled 2016-01-20 (×2): qty 2

## 2016-01-20 MED ORDER — SODIUM CHLORIDE 0.9 % IV SOLN
Freq: Once | INTRAVENOUS | Status: AC
  Administered 2016-01-20: 14:00:00 via INTRAVENOUS

## 2016-01-20 NOTE — Consult Note (Signed)
Consultation requested for incidentally noted large intra-abdominal mass contiguous with the spleen and stomach and hypercalcemia.  History of present illness:  This is 75 year old gentleman who has been admitted with worsening generalized abdominal pain 40 pound weight loss and generalized weakness and general decline in performance status over the last 3-4 months. He is a poor historian and not very forthcoming with any conversation when I visited him today. Most of the history has been obtained from the records. He denies any nausea, vomiting,diarrhea or blood in the stool.   A CT scan on admission showed a large 6.4 cm infrarenal abdominal aortic aneurysm In addition, there was a 20 cm heterogeneously enhancing partially cystic soft tissue mass that is contiguous with the proximal stomach spleen body and tail of the pancreas as well as left adrenal gland associated with extensive upper abdominal and retrocrural lymphadenopathy.  Review of systems could not be completed since patient is uncooperative  Past Medical History:  Diagnosis Date  . Asthma   . Cancer ([place])   . Hypertension    Family History  Problem Relation Age of Onset  . Jaundice Father     Exam shows him to be disoriented, slightly, restless. His neck , axillae show no lymph nodes Abdomen is soft, despite the large mass noted on the CT scan, I could nto appreciate a mass on palpation Extremities are without edema. He has been seen by vascular surgery, given the large size of the aneurysm they would like to repair this first before proceeding with any sort of surgery.  Vitals: BP 125/52 HR 82/reg RR 18 Temp 97.8  CT head and abd and pelvis reviewed.   Impression and Plan: I requested the interventional radiology consult to evaluate and advise if there is safe window for percutaneous biopsy. The differential diagnosis for the mass includes GIST tumor or lymphoma. I have ordered an LDH.  His cbc shows  reactive neutrophilia, no anemia or thrombocytopenia.  He did present with hypercalcemia at 12. He is receiving Iv hydration, Zometa given today.   I will revisit with him once again tomorrow to obtain detailed history and discuss prognosis.  Overall his prognosis does not appear to be encouraging at this point given multiple comorbid conditions. However, if this tumor is either a GIST or lymphoma, options for treatment and response are good He is also needing cardiology clearance for an endovascular repair of the aneurysm.

## 2016-01-20 NOTE — Progress Notes (Signed)
[name] is more confused today, restless, wife at bedside reports that he had hardly spoken to her. He has also been incontinent to urine and therefore has been difficiult to measure urine output, but overall, it seemd like his output is diminished  Lab review shows further increase in calcium LDH is elevated, uric acid is pending Creatinine is stable. 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  High risk for renal failure and arrhythmia.  High risk for rupture of AAA- pending repair. Pending cardiac clearance.  Plan: Will await IR opinion regarding percutaneous biopsy of the mass Monday.  Imaging f the chest in search of primary or metastasis placed on hold until his kidney function is determined to be stable and urine output improves.  I talked to the hospitalist Dr. Tressia Miners and recommended that he be transferred to ICU for closer monitoring of fluid intake and output, mental status and BP.  I have increased Iv fluid rate, added Decadron.  Continue Calcitonin.   Will follow.

## 2016-01-20 NOTE — Progress Notes (Signed)
Pt transferred to ICU room 1. Probation officer gave report to [employer], Therapist, sports. Ammie Dalton, RN

## 2016-01-20 NOTE — Progress Notes (Signed)
Pharmacy Antibiotic Note  Brian Hughes is a 75 y.o. male admitted on 01/18/2016 with pneumonia.  Pharmacy has been consulted for ceftriaxone dosing.  Plan: Patient to be started on Ceftriaxone 1g IV every 24 hours.   Height: 6\' 4"  (193 cm) Weight: 201 lb 1.6 oz (91.2 kg) IBW/kg (Calculated) : 86.8  Temp (24hrs), Avg:98 F (36.7 C), Min:97.8 F (36.6 C), Max:98.4 F (36.9 C)   Recent Labs Lab 01/18/16 1555 01/19/16 0332 01/20/16 0414  WBC 18.5* 17.2* 21.0*  CREATININE 1.10 0.92 0.95    Estimated Creatinine Clearance: 86.3 mL/min (by C-G formula based on SCr of 0.95 mg/dL).    Allergies  Allergen Reactions  . Bupropion Nausea Only and Nausea And Vomiting  . Flunisolide     Other reaction(s): Other (See Comments) Wheezing  . Lisinopril Cough  . Mometasone     Other reaction(s): Other (See Comments) Wheezing  . Terazosin     Other reaction(s): Other (See Comments) Fatigue  . Valsartan Cough    Antimicrobials this admission: Azithromycin 11/4 >>  Ceftriaxone  11/4 >>   Dose adjustments this admission: N/A  Microbiology results: 11/2 BCx: NG x 4 11/2 UCx: pending   Thank you for allowing pharmacy to be a part of this patient's care.  Olivia Canter, RPh Clinical Pharmacist 01/20/2016 7:16 AM

## 2016-01-20 NOTE — Progress Notes (Signed)
[facility] [place] NAME: Ethanjacob Polis    MR#:  [id number]  DATE OF BIRTH:  19-Dec-1940  SUBJECTIVE:  CHIEF COMPLAINT:   Chief Complaint  Patient presents with  . Weakness   -Continues to appear confused. Able to eat his breakfast fine. Does not seem to completely grasp his prognosis and diagnosis - Awaiting oncology recommendations. Need to get biopsy of his abdominal mass. Calcium still elevated.  REVIEW OF SYSTEMS:  Review of Systems  Constitutional: Positive for malaise/fatigue and weight loss. Negative for chills and fever.  HENT: Negative for ear discharge, ear pain, nosebleeds and tinnitus.   Eyes: Negative for blurred vision.  Respiratory: Negative for cough, shortness of breath and wheezing.   Cardiovascular: Negative for chest pain, palpitations and leg swelling.  Gastrointestinal: Positive for abdominal pain and constipation. Negative for diarrhea, nausea and vomiting.  Genitourinary: Negative for dysuria and urgency.  Musculoskeletal: Positive for joint pain. Negative for myalgias.  Skin: Negative for rash.  Neurological: Negative for dizziness, sensory change, speech change, focal weakness, seizures and headaches.  Psychiatric/Behavioral: Negative for depression.    DRUG ALLERGIES:   Allergies  Allergen Reactions  . Bupropion Nausea Only and Nausea And Vomiting  . Flunisolide     Other reaction(s): Other (See Comments) Wheezing  . Lisinopril Cough  . Mometasone     Other reaction(s): Other (See Comments) Wheezing  . Terazosin     Other reaction(s): Other (See Comments) Fatigue  . Valsartan Cough    VITALS:  Blood pressure (!) 148/68, pulse 91, temperature 98.4 F (36.9 C), temperature source Oral, resp. rate 18, height 6\' 4"  (1.93 m), weight 91.2 kg (201 lb 1.6 oz), SpO2 93 %.  PHYSICAL EXAMINATION:  Physical Exam  GENERAL:  75 y.o.-year-old patient lying in the bed with no acute distress.  Disheleved appearing EYES: Pupils equal, round, reactive to light and accommodation. No scleral icterus. Extraocular muscles intact.  HEENT: Head atraumatic, normocephalic. Oropharynx and nasopharynx clear.  NECK:  Supple, no jugular venous distention. No thyroid enlargement, no tenderness.  LUNGS: Normal breath sounds bilaterally, no wheezing, rales,rhonchi or crepitation. No use of accessory muscles of respiration. Diminished at the bases CARDIOVASCULAR: S1, S2 normal. No murmurs, rubs, or gallops.  ABDOMEN: Soft, some tenderness in LUQ, nondistended. Bowel sounds present. No organomegaly or mass.  EXTREMITIES: No pedal edema, cyanosis, or clubbing.  NEUROLOGIC: Cranial nerves II through XII are intact. Muscle strength 5/5 in all extremities. Sensation intact. Gait not checked. Global weakness noted. PSYCHIATRIC: The patient is alert and oriented x 1-2.  SKIN: No obvious rash, lesion, or ulcer.    LABORATORY PANEL:   CBC  Recent Labs Lab 01/20/16 0414  WBC 21.0*  HGB 12.2*  HCT 36.8*  PLT 395   ------------------------------------------------------------------------------------------------------------------  Chemistries   Recent Labs Lab 01/18/16 1911  01/20/16 0414  NA  --   < > 141  K  --   < > 3.6  CL  --   < > 98*  CO2  --   < > 36*  GLUCOSE  --   < > 100*  BUN  --   < > 24*  CREATININE  --   < > 0.95  CALCIUM  --   < > 14.3*  MG 1.7  --   --   AST 23  --   --   ALT 10*  --   --   ALKPHOS 72  --   --  BILITOT 0.9  --   --   < > = values in this interval not displayed. ------------------------------------------------------------------------------------------------------------------  Cardiac Enzymes  Recent Labs Lab 01/18/16 1555  TROPONINI <0.03   ------------------------------------------------------------------------------------------------------------------  RADIOLOGY:  Dg Chest 2 View  Result Date: 01/18/2016 CLINICAL DATA:  Pt says he has been  weak and unable to walk and be steady on his feet for about 3 weeks. Hx of asthma. Former smoker EXAM: CHEST  2 VIEW COMPARISON:  12/21/2015 FINDINGS: Heart size is normal. There is patchy infiltrate in the lingula consistent with infectious process. No pulmonary edema. No pleural effusions. Mild bronchitic changes are present. IMPRESSION: 1. Bronchitic changes. 2. Infiltrate in the lingula. Electronically Signed   By: Nolon Nations M.D.   On: 01/18/2016 19:31   Ct Head Wo Contrast  Result Date: 01/18/2016 CLINICAL DATA:  75 year old male with abdominal pain, large abdominal tumor, increased weakness. Falls and inability to walk. Initial encounter. EXAM: CT HEAD WITHOUT CONTRAST TECHNIQUE: Contiguous axial images were obtained from the base of the skull through the vertex without intravenous contrast. COMPARISON:  CT Abdomen and Pelvis from today reported separately. 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 No midline shift, ventriculomegaly, mass effect, parenchymal mass lesion, intracranial hemorrhage or evidence of cortically based acute infarction. Gray-white matter differentiation is within normal limits throughout the brain. Cerebral volume is within normal limits for age. Vascular: Calcified atherosclerosis at the skull base. Skull: No osseous abnormality identified. Sinuses/Orbits: Visualized paranasal sinuses and mastoids are well pneumatized. Right maxillary periosteal thickening. Other: No acute orbit or scalp soft tissue finding. IMPRESSION: Essentially normal for age noncontrast CT appearance of the brain. Note that early metastatic disease to the brain is difficult to exclude in the absence of intravenous contrast, and Brain MRI without and with contrast would best stage the brain as necessary. Electronically Signed   By: Genevie Ann M.D.   On: 01/18/2016 20:40   Ct Abdomen Pelvis W Contrast  Addendum Date: 01/18/2016     ADDENDUM REPORT: 01/18/2016 20:46 ADDENDUM: Study discussed by telephone with Dr. Eula Listen on 01/18/2016 at 20:45 . Electronically Signed   By: Genevie Ann M.D.   On: 01/18/2016 20:46   Result Date: 01/18/2016 CLINICAL DATA:  75 year old male with bilateral lower abdominal pain. Increased weakness. Falls. Inability to walk. Initial encounter. EXAM: CT ABDOMEN AND PELVIS WITH CONTRAST TECHNIQUE: Multidetector CT imaging of the abdomen and pelvis was performed using the standard protocol following bolus administration of intravenous contrast. CONTRAST:  136mL ISOVUE-300 IOPAMIDOL (ISOVUE-300) INJECTION 61% COMPARISON:  Chest and abdominal radiographic series 10517. FINDINGS: Lower chest: No pericardial or pleural effusion. Lower lobe subpleural reticular opacity. No upper abdominal free air. Hepatobiliary: Negative liver. Surgically absent gallbladder. No intra or extrahepatic biliary duct enlargement. Pancreas/Spleen: There is a large heterogeneously enhancing solid mass occupying the left upper quadrant and lesser sac inseparable from the spleen, the proximal stomach, left adrenal gland, and the pancreatic body and tail. The mass encompasses 19.7 x 19.6 x 16 cm (AP by transverse by CC). The splenic flexure appears to be inferiorly displaced along the anterior and caudal aspect of the large tumor. There is mass effect on the proximal gastric lumen. The pancreatic head and uncinate process are unaffected and appear normal. There is superimposed extensive gastrohepatic ligament, retrocrural, and celiac axis lymphadenopathy, with individual nodal enlargement up to 3.6 cm short axis. New numerous small but increased in number mesenteric nodes throughout the root  of the mesenteric. Adrenals/Urinary Tract: The left adrenal gland is involved as above. The right adrenal gland is normal. Bilateral renal enhancement and post contrast excretion is within normal limits. Stomach/Bowel: Abnormal proximal stomach as  above. Oral contrast was administered and has reached the distal small bowel, but not yet reached the terminal ileum. The duodenum and proximal small bowel loops are not directly involved by the large tumor. As stated above, the splenic flexure appears displaced and not directly involved. There is widespread diverticulosis of the colon. The large bowel is largely decompressed. No dilated small bowel. Vascular/Lymphatic: Large up to 6.4 cm diameter infrarenal abdominal aortic aneurysm with prominent mural plaque or thrombus. See series 2, image 54 and sagittal image 64. Calcified aortic atherosclerosis. Major arterial structures in the abdomen and pelvis remain patent. 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 The portal venous system is patent. Extensive upper abdominal lymphadenopathy as stated above. Reproductive: Penile implant. Other: Small volume pelvic free fluid. Unremarkable urinary bladder. Musculoskeletal: No acute or suspicious osseous lesion identified. IMPRESSION: 1. Large, nearly 20 cm heterogeneously enhancing and partially cystic or necrotic soft tissue mass inseparable from the proximal stomach, spleen, body and tail of the pancreas, and left adrenal gland. Extensive upper abdominal and retrocrural lymphadenopathy. No evidence of liver metastatic disease or biliary obstruction. Top differential considerations include Lymphoma, GIST, other gastric carcinoma, other primary splenic malignancy, and less likely pancreatic carcinoma. 2. Large up to 6.4 cm infrarenal abdominal aortic aneurysm. No evidence of acute rupture. Vascular surgery consultation recommended due to increased risk of rupture for AAA >5.5 cm. This recommendation follows ACR consensus guidelines: White Paper of the ACR Incidental Findings Committee II on Vascular Findings. J Am Coll Radiol 2013; 10:789-794. 3. 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 Electronically Signed: By: Genevie Ann M.D. On: 01/18/2016 20:37    EKG:   Orders placed or performed during the hospital encounter of 01/18/16  . ED EKG  . ED EKG  . EKG 12-Lead  . EKG 12-Lead    ASSESSMENT AND PLAN:   75 year old male with past medical history significant for asthma, hypertension presents to the hospital secondary to weight loss, abdominal pain and constipation going on for almost 4 months now.  #1 Hypercalcemia and hypokalemia-hypercalcemia is likely secondary to underlying malignancy. -Receiving IV fluids. Started calcitonin as well. Zometa given 01/19/2016. Typically wait a week before repeating the dose. -Follow-up and oncology consulted -Hypokalemia is being replaced appropriately  #2 abdominal mass-20 cm x 20 cm abdominal mass of unknown primary. Possible underlying malignancy -High suspicion for lymphoma. LDH is elevated Oncology has been consulted. Likely might need a CT-guided biopsy -IR consultation for the same. Anticipate biopsy on Monday -Further workup per oncology.  #3 AAA- infrarenal AAA of about 6.5cm noted with no rupture but has has mural plaque Vascular surgery consulted- cardiac clearance requested Surgery scheduled in 10 days as outpatient  #4 asthma/COPD-continue inhalers and nebulizers as needed  #5 Hypertension-continue lisinopril  #6 DVT prophylaxis-on Lovenox  #7 leukocytosis-differential with 90% neutrophils. Chest x-ray showing lingular infiltrate. Started on Rocephin and azithromycin. UA without any underlying infection   Physical therapy consulted for weakness     All the records are reviewed and case discussed with Care Management/Social Workerr. Management plans discussed with the patient, family and they are in agreement.  CODE STATUS: DNR -discussed With the patient,  Gerald Stabs, RN present  TOTAL TIME TAKING CARE OF THIS PATIENT: 41 minutes.    POSSIBLE D/C IN 3 DAYS, DEPENDING ON CLINICAL CONDITION.   Seaborn Nakama M.D on 01/20/2016 at 1:20 PM  Between 7am to 6pm - Pager - [phone]  After 6pm go to [email or web address] - password [email or web address] [facility]  Office  [phone]  CC: Primary care physician; No PCP Per Patient

## 2016-01-21 ENCOUNTER — Inpatient Hospital Stay (HOSPITAL_COMMUNITY)
Admit: 2016-01-21 | Discharge: 2016-01-21 | Disposition: A | Discharge status: Home / Self Care | Payer: Medicare Other | Attending: Internal Medicine | Admitting: Internal Medicine

## 2016-01-21 ENCOUNTER — Inpatient Hospital Stay: Payer: Medicare Other

## 2016-01-21 DIAGNOSIS — Z01810 Encounter for preprocedural cardiovascular examination: Secondary | ICD-10-CM

## 2016-01-21 DIAGNOSIS — I517 Cardiomegaly: Secondary | ICD-10-CM

## 2016-01-21 DIAGNOSIS — I714 Abdominal aortic aneurysm, without rupture: Secondary | ICD-10-CM

## 2016-01-21 LAB — BASIC METABOLIC PANEL
ANION GAP: 7 (ref 5–15)
BUN: 25 mg/dL — ABNORMAL HIGH (ref 6–20)
CALCIUM: 12 mg/dL — AB (ref 8.9–10.3)
CO2: 29 mmol/L (ref 22–32)
Chloride: 103 mmol/L (ref 101–111)
Creatinine, Ser: 0.96 mg/dL (ref 0.61–1.24)
GLUCOSE: 111 mg/dL — AB (ref 65–99)
Potassium: 4.4 mmol/L (ref 3.5–5.1)
Sodium: 139 mmol/L (ref 135–145)

## 2016-01-21 LAB — CBC
HCT: 34.8 % — ABNORMAL LOW (ref 40.0–52.0)
Hemoglobin: 11.1 g/dL — ABNORMAL LOW (ref 13.0–18.0)
MCH: 27.5 pg (ref 26.0–34.0)
MCHC: 31.8 g/dL — ABNORMAL LOW (ref 32.0–36.0)
MCV: 86.4 fL (ref 80.0–100.0)
PLATELETS: 342 10*3/uL (ref 150–440)
RBC: 4.03 MIL/uL — ABNORMAL LOW (ref 4.40–5.90)
RDW: 15.1 % — AB (ref 11.5–14.5)
WBC: 20.8 10*3/uL — AB (ref 3.8–10.6)

## 2016-01-21 LAB — ECHOCARDIOGRAM COMPLETE
Height: 76 in
Weight: 3217.6 oz

## 2016-01-21 MED ORDER — FUROSEMIDE 10 MG/ML IJ SOLN
40.0000 mg | Freq: Once | INTRAMUSCULAR | Status: AC
  Administered 2016-01-21: 40 mg via INTRAVENOUS
  Filled 2016-01-21: qty 4

## 2016-01-21 MED ORDER — IOPAMIDOL (ISOVUE-300) INJECTION 61%
75.0000 mL | Freq: Once | INTRAVENOUS | Status: AC | PRN
  Administered 2016-01-21: 75 mL via INTRAVENOUS

## 2016-01-21 NOTE — Clinical Social Work Note (Signed)
Clinical Social Work Assessment  Patient Details  Name: Brian Hughes MRN: [id number] Date of Birth: 1940/09/23  Date of referral:  01/21/16               Reason for consult:  Facility Placement                Permission sought to share information with:    Permission granted to share information::  No  Name::        Agency::     Relationship::     Contact Information:     Housing/Transportation Living arrangements for the past 2 months:  Single Family Home Source of Information:  Patient, Partner Patient Interpreter Needed:  None Criminal Activity/Legal Involvement Pertinent to Current Situation/Hospitalization:  No - Comment as needed Significant Relationships:  Adult Children, Other Family Members, Significant Other Lives with:  Adult Children Do you feel safe going back to the place where you live?  Yes Need for family participation in patient care:  No (Coment)  Care giving concerns: STR   Social Worker assessment / plan:  The CSW visited the patient, his girlfriend, and his girlfriend's daughter at bedside to discuss dc planning. The patient adamantly refused SNF or HHPT at this time. The CSW provided resources for future HCPOA planning to the patient's girlfriend's daughter concerning the patient's adult son who has vision deficiencies and ID/DD diagnoses.   CSW will con't to follow pending any changes to dc planning needs.  Employment status:  Retired Forensic scientist:  Medicare PT Recommendations:  [place] / Referral to community resources:     Patient/Family's Response to care:  Patient was cooperative but declined services. Patient's family attempted to sway. CSW attempted to sway, to no avail.  Patient/Family's Understanding of and Emotional Response to Diagnosis, Current Treatment, and Prognosis:  Patient does not seem to understand the level of care needs required for his situation.  Emotional Assessment Appearance:   Appears stated age Attitude/Demeanor/Rapport:  Avoidant Affect (typically observed):  Appropriate, Defensive Orientation:  Oriented to Self, Oriented to Place, Oriented to  Time, Oriented to Situation Alcohol / Substance use:  Never Used Psych involvement (Current and /or in the community):  No (Comment)  Discharge Needs  Concerns to be addressed:  Patient refuses services Readmission within the last 30 days:  No Current discharge risk:  Chronically ill Barriers to Discharge:  Continued Medical Work up   [employer], LCSW 01/21/2016, 2:05 PM

## 2016-01-21 NOTE — Consult Note (Signed)
Primary Physician: Primary Cardiologist:  New     HPI: Asked to see re preop evaluation/clearance   Pt is a 75 yo Hx of asthma, hypertension presented to hosp abdominal mass, consitipation  and wt loss.  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    Patient denies CP  He was very active last spring and even into the summer some  Makes deliveries  Vigorous  OVer the summer he was limited by abdominal discomfort as well as wt loss  No CP         Past Medical History:  Diagnosis Date  . Asthma   . Cancer ([place])   . Hypertension     Medications Prior to Admission  Medication Sig Dispense Refill  . lisinopril-hydrochlorothiazide (PRINZIDE,ZESTORETIC) 10-12.5 MG tablet Take 1 tablet by mouth daily.    . pantoprazole (PROTONIX) 40 MG tablet Take 1 tablet by mouth daily.    . simethicone (GAS-X) 80 MG chewable tablet Chew 1 tablet (80 mg total) by mouth 3 (three) times daily. 100 tablet 2  . sucralfate (CARAFATE) 1 g tablet Take 1 tablet by mouth daily.    . VENTOLIN HFA 108 (90 Base) MCG/ACT inhaler Inhale 1-2 puffs into the lungs every 6 (six) hours as needed.        Marland Kitchen azithromycin  500 mg Oral Daily  . calcitonin (salmon)  1 spray Alternating Nares Daily  . cefTRIAXone  1 g Intravenous Q24H  . docusate sodium  100 mg Oral BID  . enoxaparin (LOVENOX) injection  40 mg Subcutaneous Q24H  . feeding supplement (ENSURE ENLIVE)  237 mL Oral TID BM  . mouth rinse  15 mL Mouth Rinse BID  . pantoprazole  40 mg Oral Daily  . senna  1 tablet Oral BID  . sodium chloride flush  3 mL Intravenous Q12H    Infusions: . 0.9 % NaCl with KCl 20 mEq / L 125 mL/hr at 01/21/16 0900    Allergies  Allergen Reactions  . Bupropion Nausea Only and Nausea And Vomiting  . Flunisolide     Other reaction(s): Other (See Comments) Wheezing  .  Lisinopril Cough  . Mometasone     Other reaction(s): Other (See Comments) Wheezing  . Terazosin     Other reaction(s): Other (See Comments) Fatigue  . Valsartan Cough    Social History   Social History  . Marital status: Widowed    Spouse name: N/A  . Number of children: N/A  . Years of education: N/A   Occupational History  . Not on file.   Social History Main Topics  . Smoking status: Never Smoker  . Smokeless tobacco: Never Used  . Alcohol use No  . Drug use: Unknown  . Sexual activity: Not on file   Other Topics Concern  . Not on file   Social History Narrative  . No narrative on file    Family History  Problem Relation Age of Onset  . Jaundice Father     REVIEW OF SYSTEMS:  All systems reviewed  Negative to the above problem except as noted above.    PHYSICAL EXAM: Vitals:   01/21/16 0800 01/21/16 0940  BP: (!) 117/56 123/61  Pulse: 83 87  Resp: (!) 23   Temp: 97.7 F (36.5 C) 98.6 F (37 C)     Intake/Output  Summary (Last 24 hours) at 01/21/16 1041 Last data filed at 01/21/16 0900  Gross per 24 hour  Intake             1065 ml  Output             1700 ml  Net             -635 ml    General:  Well appearing. No respiratory difficulty HEENT: normal Neck: supple. no JVD. Carotids 2+ bilat; no bruits. No lymphadenopathy or thryomegaly appreciated. Cor: PMI nondisplaced. Regular rate & rhythm. No rubs, gallops or murmurs. Lungs: clear Abdomen: soft, nontender, nondistended. No hepatosplenomegaly. No bruits or masses. Good bowel sounds. Extremities: no cyanosis, clubbing, rash, edema Neuro: alert & oriented x 3, cranial nerves grossly intact. moves all 4 extremities w/o difficulty. Affect pleasant.  ECG:  SR   LVH  Septal MI    Nonspecific ST T wave changes    Results for orders placed or performed during the hospital encounter of 01/18/16 (from the past 24 hour(s))  MRSA PCR Screening     Status: None   Collection Time: 01/20/16  3:09 PM    Result Value Ref Range   MRSA by PCR NEGATIVE NEGATIVE  Glucose, capillary     Status: None   Collection Time: 01/20/16  3:19 PM  Result Value Ref Range   Glucose-Capillary 86 65 - 99 mg/dL  Uric acid     Status: Abnormal   Collection Time: 01/20/16  5:06 PM  Result Value Ref Range   Uric Acid, Serum 8.4 (H) 4.4 - 7.6 mg/dL  CBC     Status: Abnormal   Collection Time: 01/21/16  5:17 AM  Result Value Ref Range   WBC 20.8 (H) 3.8 - 10.6 K/uL   RBC 4.03 (L) 4.40 - 5.90 MIL/uL   Hemoglobin 11.1 (L) 13.0 - 18.0 g/dL   HCT 34.8 (L) 40.0 - 52.0 %   MCV 86.4 80.0 - 100.0 fL   MCH 27.5 26.0 - 34.0 pg   MCHC 31.8 (L) 32.0 - 36.0 g/dL   RDW 15.1 (H) 11.5 - 14.5 %   Platelets 342 150 - 440 K/uL  Basic metabolic panel     Status: Abnormal   Collection Time: 01/21/16  5:17 AM  Result Value Ref Range   Sodium 139 135 - 145 mmol/L   Potassium 4.4 3.5 - 5.1 mmol/L   Chloride 103 101 - 111 mmol/L   CO2 29 22 - 32 mmol/L   Glucose, Bld 111 (H) 65 - 99 mg/dL   BUN 25 (H) 6 - 20 mg/dL   Creatinine, Ser 0.96 0.61 - 1.24 mg/dL   Calcium 12.0 (H) 8.9 - 10.3 mg/dL   GFR calc non Af Amer >60 >60 mL/min   GFR calc Af Amer >60 >60 mL/min   Anion gap 7 5 - 15   Ct Chest W Contrast  Result Date: 01/21/2016 CLINICAL DATA:  Asthma, hypertension, weight loss, abdominal pain and constipation, 20 x 20 cm abdominal mass of unknown primary possible malignancy, question lymphoma, elevated LDH EXAM: CT CHEST WITH CONTRAST TECHNIQUE: Multidetector CT imaging of the chest was performed during intravenous contrast administration. Sagittal and coronal MPR images reconstructed from axial data set. CONTRAST:  73mL ISOVUE-300 IOPAMIDOL (ISOVUE-300) INJECTION 61% IV COMPARISON:  None; correlation CT abdomen and pelvis 01/18/2016 FINDINGS: Cardiovascular: Atherosclerotic calcifications aorta, proximal great vessels, and coronary arteries. Thoracic vascular structures grossly patent on nondedicated exam. No pericardial  effusion. Mediastinum/Nodes:  No thoracic adenopathy. Air-filled esophagus throughout its length. Base of cervical region unremarkable Lungs/Pleura: Small BILATERAL pleural effusions. Compressive atelectasis BILATERAL lower lobes. Question RIGHT lower lobe nodule 18 x 14 mm image 89. Mild peribronchial thickening. Underlying emphysematous changes particularly of the upper lobes. Questionable subpleural nodule posterior LEFT upper lobe 10 mm diameter image 46 versus atelectasis. Upper Abdomen: Large LEFT upper quadrant mass contiguous with stomach and spleen as well as tail of pancreas and upper abdominal adenopathy, better delineated on recent CT abdomen/pelvis exam. Small amount of free fluid in upper abdomen. Musculoskeletal: No acute osseous findings. IMPRESSION: Small BILATERAL pleural effusions. Large LEFT upper quadrant mass adenopathy as reported on CT abdomen exam. Subpleural nodule RIGHT lower lobe 18 x 14 mm with question additional subpleural nodule posterior LEFT upper lobe 10 mm greatest size. Underlying emphysematous changes without additional mass or adenopathy. Electronically Signed   By: Lavonia Dana M.D.   On: 01/21/2016 09:48     ASSESSMENT: Pt is a 75 yo who presented with wt loss and abdominal pain  Found to have a large infrarenal aneursym as well as masses and LAD in the abdomen  Being evaluated for surgery He has CAD noted on CT scan yestreday  But, I am not convinced of active angina  He was very function until he begain feeling bad with abdominal pain this summer On exam, no evid of signif volume overload  EKG with SR  Echo showed normal LV systolic funciton  From a cardiac standpoint I think he is at rel low risk for major cardiac event with endovasc procedure; some increase with open procedure but not prohibitive  I would recomm proceeding  Follow I/O closely  Watch  Hgb.  BP is adequately controlled  WOuld check lipids   Will continue to follow.

## 2016-01-21 NOTE — Progress Notes (Signed)
*  PRELIMINARY RESULTS* Echocardiogram 2D Echocardiogram has been performed.  Brian Hughes 01/21/2016, 11:32 AM

## 2016-01-21 NOTE — Progress Notes (Signed)
Pt to be transferred to room 124, report called to Shirlean Mylar, RN receiving patient.  Pt is alert, confused x2.  NSR, 2L [place], lungs diminised.  Fluids infusing at 125 ml/hr, received 40 mg one time dose lasix, post lasix put out 600 ml urine in condom catheter.  VSS, afebrile.  Pt tolerating diet.  Pt to go for CT scan, spoke with radiology.  Plan is for radiology orderly to take pt to CT then transport pt to room 124 post scan.

## 2016-01-21 NOTE — Care Management Important Message (Signed)
Important Message  Patient Details  Name: Brian Hughes MRN: [id number] Date of Birth: [birth year]   Medicare Important Message Given:  Yes    [name] A, RN 01/21/2016, 11:05 AM

## 2016-01-21 NOTE — Progress Notes (Signed)
[facility] [place] NAME: Brian Hughes    MR#:  [id number]  DATE OF BIRTH:  [birth year]  SUBJECTIVE:  CHIEF COMPLAINT:   Chief Complaint  Patient presents with  . Weakness   - very confused yesterday and so transferred to ICU for close monitoring. - more alert today, calcium down to 12 today - due to increased IV fluids rate- hands and legs swollen this morning  REVIEW OF SYSTEMS:  Review of Systems  Constitutional: Positive for malaise/fatigue and weight loss. Negative for chills and fever.  HENT: Negative for ear discharge, nosebleeds and tinnitus.   Eyes: Negative for blurred vision.  Respiratory: Negative for cough, shortness of breath and wheezing.   Cardiovascular: Positive for leg swelling. Negative for chest pain and palpitations.  Gastrointestinal: Positive for abdominal pain and constipation. Negative for diarrhea, nausea and vomiting.  Genitourinary: Negative for dysuria and urgency.  Musculoskeletal: Positive for joint pain. Negative for myalgias.  Skin: Negative for rash.  Neurological: Negative for dizziness, sensory change, speech change, focal weakness, seizures and headaches.  Psychiatric/Behavioral: Negative for depression.       Some confusion still present.    DRUG ALLERGIES:   Allergies  Allergen Reactions  . Bupropion Nausea Only and Nausea And Vomiting  . Flunisolide     Other reaction(s): Other (See Comments) Wheezing  . Lisinopril Cough  . Mometasone     Other reaction(s): Other (See Comments) Wheezing  . Terazosin     Other reaction(s): Other (See Comments) Fatigue  . Valsartan Cough    VITALS:  Blood pressure (!) 117/56, pulse 83, temperature 97.7 F (36.5 C), temperature source Oral, resp. rate (!) 23, height 6\' 4"  (1.93 m), weight 91.2 kg (201 lb 1.6 oz), SpO2 94 %.  PHYSICAL EXAMINATION:  Physical Exam  GENERAL:  75 y.o.-year-old patient lying in the bed with no acute distress.  More alert EYES: Pupils equal, round, reactive to light and accommodation. No scleral icterus. Extraocular muscles intact.  HEENT: Head atraumatic, normocephalic. Oropharynx and nasopharynx clear.  NECK:  Supple, no jugular venous distention. No thyroid enlargement, no tenderness.  LUNGS: Normal breath sounds bilaterally, no wheezing, rales,rhonchi or crepitation. No use of accessory muscles of respiration. Diminished at the bases CARDIOVASCULAR: S1, S2 normal. No murmurs, rubs, or gallops.  ABDOMEN: Soft, tenderness in LUQ, abdomen appears distended. Bowel sounds present. No organomegaly or mass.  EXTREMITIES: No pedal edema, cyanosis, or clubbing.  NEUROLOGIC: Cranial nerves II through XII are intact. Muscle strength 5/5 in all extremities. Sensation intact. Gait not checked. Global weakness noted. PSYCHIATRIC: The patient is alert and oriented x 2. Intermittent confusion present. SKIN: No obvious rash, lesion, or ulcer.    LABORATORY PANEL:   CBC  Recent Labs Lab 01/21/16 0517  WBC 20.8*  HGB 11.1*  HCT 34.8*  PLT 342   ------------------------------------------------------------------------------------------------------------------  Chemistries   Recent Labs Lab 01/18/16 1911  01/21/16 0517  NA  --   < > 139  K  --   < > 4.4  CL  --   < > 103  CO2  --   < > 29  GLUCOSE  --   < > 111*  BUN  --   < > 25*  CREATININE  --   < > 0.96  CALCIUM  --   < > 12.0*  MG 1.7  --   --   AST 23  --   --   ALT 10*  --   --  ALKPHOS 72  --   --   BILITOT 0.9  --   --   < > = values in this interval not displayed. ------------------------------------------------------------------------------------------------------------------  Cardiac Enzymes  Recent Labs Lab 01/18/16 1555  TROPONINI <0.03   ------------------------------------------------------------------------------------------------------------------  RADIOLOGY:  No results found.  EKG:   Orders placed or  performed during the hospital encounter of 01/18/16  . ED EKG  . ED EKG  . EKG 12-Lead  . EKG 12-Lead    ASSESSMENT AND PLAN:   75 year old male with past medical history significant for asthma, hypertension presents to the hospital secondary to weight loss, abdominal pain and constipation going on for almost 4 months now.  #1 Hypercalcemia-hypercalcemia is likely secondary to underlying malignancy. -Receiving IV fluids. Decrease rate today, give 1 dose of lasix. -Started calcitonin as well. Zometa given 01/19/2016. Also steroids were added -monitor -Hypokalemia is being replaced appropriately  #2 abdominal mass-20 cm x 20 cm abdominal mass of unknown primary. Possible underlying malignancy -High suspicion for lymphoma. LDH is elevated, as is uric acid. - Oncology has been consulted.Will need a CT-guided biopsy. -IR consultation for the same. Anticipate biopsy on Monday -Further workup per oncology. - will also need CT of the chest  #3 Confusion- metabolic encephalopathy- likely from under;ying infection and hypercalcemia- monitor closely Improved today, no focal deficits - CT head on admission normal, if lung mass suspected or further neurological changes seen- will order MRI of the brain.  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 Surgery scheduled in 10 days as outpatient  #4 asthma/COPD-continue inhalers and nebulizers as needed  #5 Hypertension-hold lisinopril as BP low normal  #6 DVT prophylaxis-on Lovenox- hold for possible biopsy tomorrow  #7 Leukocytosis-differential with 90% neutrophils. Chest x-ray showing lingular infiltrate. CAP Started on Rocephin and azithromycin. UA without any underlying infection   Physical therapy consulted for weakness- recommended SNF at discharge     All the records are reviewed and case discussed with Care  Management/Social Workerr. Management plans discussed with the patient, family and they are in agreement.  CODE STATUS: DNR -  Also discussed with son and patients girlfriend yesterday  TOTAL TIME TAKING CARE OF THIS PATIENT: 39 minutes. Marland Kitchen   Gladstone Lighter M.D on 01/21/2016 at 8:48 AM  Between 7am to 6pm - Pager - [phone]  After 6pm go to [email or web address] - password [email or web address] [facility]  Office  [phone]  CC: Primary care physician; No PCP Per Patient

## 2016-01-21 NOTE — Progress Notes (Signed)
Pt transported to CT, will go to room 124 once CT scan complete.

## 2016-01-22 ENCOUNTER — Inpatient Hospital Stay: Payer: Medicare Other

## 2016-01-22 LAB — BASIC METABOLIC PANEL
ANION GAP: 9 (ref 5–15)
BUN: 26 mg/dL — ABNORMAL HIGH (ref 6–20)
CO2: 30 mmol/L (ref 22–32)
Calcium: 11.5 mg/dL — ABNORMAL HIGH (ref 8.9–10.3)
Chloride: 100 mmol/L — ABNORMAL LOW (ref 101–111)
Creatinine, Ser: 0.85 mg/dL (ref 0.61–1.24)
GLUCOSE: 91 mg/dL (ref 65–99)
POTASSIUM: 4.4 mmol/L (ref 3.5–5.1)
Sodium: 139 mmol/L (ref 135–145)

## 2016-01-22 LAB — LIPID PANEL
CHOL/HDL RATIO: 2.8 ratio
Cholesterol: 90 mg/dL (ref 0–200)
HDL: 32 mg/dL — AB (ref >40)
LDL CALC: 38 mg/dL (ref 0–99)
Triglycerides: 101 mg/dL (ref <150)
VLDL: 20 mg/dL (ref 0–40)

## 2016-01-22 LAB — PROTIME-INR
INR: 1.24
PROTHROMBIN TIME: 15.7 s — AB (ref 11.4–15.2)

## 2016-01-22 LAB — APTT: aPTT: 41 seconds — ABNORMAL HIGH (ref 24–36)

## 2016-01-22 MED ORDER — MIDAZOLAM HCL 5 MG/5ML IJ SOLN
INTRAMUSCULAR | Status: AC | PRN
  Administered 2016-01-22: 1 mg via INTRAVENOUS

## 2016-01-22 MED ORDER — FENTANYL CITRATE (PF) 100 MCG/2ML IJ SOLN
INTRAMUSCULAR | Status: AC | PRN
  Administered 2016-01-22: 50 ug via INTRAVENOUS

## 2016-01-22 MED ORDER — HYDROMORPHONE HCL 1 MG/ML IJ SOLN
1.0000 mg | INTRAMUSCULAR | Status: DC | PRN
  Administered 2016-01-22 – 2016-01-31 (×37): 1 mg via INTRAVENOUS
  Filled 2016-01-22 (×37): qty 1

## 2016-01-22 MED ORDER — SODIUM CHLORIDE 0.9 % IV SOLN
INTRAVENOUS | Status: DC
  Administered 2016-01-22: 09:00:00 via INTRAVENOUS

## 2016-01-22 MED ORDER — FENTANYL CITRATE (PF) 100 MCG/2ML IJ SOLN
INTRAMUSCULAR | Status: AC
  Filled 2016-01-22: qty 4

## 2016-01-22 MED ORDER — MIDAZOLAM HCL 5 MG/5ML IJ SOLN
INTRAMUSCULAR | Status: AC
  Filled 2016-01-22: qty 5

## 2016-01-22 NOTE — Progress Notes (Signed)
[facility] [place] NAME: Jarrette Baich    MR#:  [id number]  DATE OF BIRTH:  [birth year]  SUBJECTIVE:  CHIEF COMPLAINT:   Chief Complaint  Patient presents with  . Weakness   - alert and confused, had his abdominal mass biopsy done today - calcium is better but still elevated  REVIEW OF SYSTEMS:  Review of Systems  Constitutional: Positive for malaise/fatigue and weight loss. Negative for chills and fever.  HENT: Negative for ear discharge, nosebleeds and tinnitus.   Eyes: Negative for blurred vision.  Respiratory: Negative for cough, shortness of breath and wheezing.   Cardiovascular: Positive for leg swelling. Negative for chest pain and palpitations.  Gastrointestinal: Positive for abdominal pain and constipation. Negative for diarrhea, nausea and vomiting.  Genitourinary: Negative for dysuria and urgency.  Musculoskeletal: Positive for joint pain. Negative for myalgias.  Skin: Negative for rash.  Neurological: Negative for dizziness, sensory change, speech change, focal weakness, seizures and headaches.  Psychiatric/Behavioral: Negative for depression.       Some confusion still present.    DRUG ALLERGIES:   Allergies  Allergen Reactions  . Bupropion Nausea Only and Nausea And Vomiting  . Flunisolide     Other reaction(s): Other (See Comments) Wheezing  . Lisinopril Cough  . Mometasone     Other reaction(s): Other (See Comments) Wheezing  . Terazosin     Other reaction(s): Other (See Comments) Fatigue  . Valsartan Cough    VITALS:  Blood pressure 130/70, pulse 82, temperature 98 F (36.7 C), temperature source Oral, resp. rate 18, height 6\' 4"  (1.93 m), weight 100.9 kg (222 lb 8 oz), SpO2 93 %.  PHYSICAL EXAMINATION:  Physical Exam  GENERAL:  75 y.o.-year-old patient lying in the bed with no acute distress. More alert EYES: Pupils equal, round, reactive to light and accommodation. No scleral icterus.  Extraocular muscles intact.  HEENT: Head atraumatic, normocephalic. Oropharynx and nasopharynx clear.  NECK:  Supple, no jugular venous distention. No thyroid enlargement, no tenderness.  LUNGS: Normal breath sounds bilaterally, no wheezing, rales,rhonchi or crepitation. No use of accessory muscles of respiration. Diminished at the bases CARDIOVASCULAR: S1, S2 normal. No murmurs, rubs, or gallops.  ABDOMEN: Soft, tenderness in LUQ, abdomen appears distended. Bowel sounds present. No organomegaly or mass.  EXTREMITIES: No pedal edema, cyanosis, or clubbing.  NEUROLOGIC: Cranial nerves II through XII are intact. Muscle strength 5/5 in all extremities. Sensation intact. Gait not checked. Global weakness noted. PSYCHIATRIC: The patient is alert and oriented x 2. Intermittent confusion present. SKIN: No obvious rash, lesion, or ulcer.    LABORATORY PANEL:   CBC  Recent Labs Lab 01/21/16 0517  WBC 20.8*  HGB 11.1*  HCT 34.8*  PLT 342   ------------------------------------------------------------------------------------------------------------------  Chemistries   Recent Labs Lab 01/18/16 1911  01/22/16 0532  NA  --   < > 139  K  --   < > 4.4  CL  --   < > 100*  CO2  --   < > 30  GLUCOSE  --   < > 91  BUN  --   < > 26*  CREATININE  --   < > 0.85  CALCIUM  --   < > 11.5*  MG 1.7  --   --   AST 23  --   --   ALT 10*  --   --   ALKPHOS 72  --   --   BILITOT 0.9  --   --   < > =  values in this interval not displayed. ------------------------------------------------------------------------------------------------------------------  Cardiac Enzymes  Recent Labs Lab 01/18/16 1555  TROPONINI <0.03   ------------------------------------------------------------------------------------------------------------------  RADIOLOGY:  Ct Chest W Contrast  Result Date: 01/21/2016 CLINICAL DATA:  Asthma, hypertension, weight loss, abdominal pain and constipation, 20 x 20 cm abdominal  mass of unknown primary possible malignancy, question lymphoma, elevated LDH EXAM: CT CHEST WITH CONTRAST TECHNIQUE: Multidetector CT imaging of the chest was performed during intravenous contrast administration. Sagittal and coronal MPR images reconstructed from axial data set. CONTRAST:  11mL ISOVUE-300 IOPAMIDOL (ISOVUE-300) INJECTION 61% IV COMPARISON:  None; correlation CT abdomen and pelvis 01/18/2016 FINDINGS: Cardiovascular: Atherosclerotic calcifications aorta, proximal great vessels, and coronary arteries. Thoracic vascular structures grossly patent on nondedicated exam. No pericardial effusion. Mediastinum/Nodes: No thoracic adenopathy. Air-filled esophagus throughout its length. Base of cervical region unremarkable Lungs/Pleura: Small BILATERAL pleural effusions. Compressive atelectasis BILATERAL lower lobes. Question RIGHT lower lobe nodule 18 x 14 mm image 89. Mild peribronchial thickening. Underlying emphysematous changes particularly of the upper lobes. Questionable subpleural nodule posterior LEFT upper lobe 10 mm diameter image 46 versus atelectasis. Upper Abdomen: Large LEFT upper quadrant mass contiguous with stomach and spleen as well as tail of pancreas and upper abdominal adenopathy, better delineated on recent CT abdomen/pelvis exam. Small amount of free fluid in upper abdomen. Musculoskeletal: No acute osseous findings. IMPRESSION: Small BILATERAL pleural effusions. Large LEFT upper quadrant mass adenopathy as reported on CT abdomen exam. Subpleural nodule RIGHT lower lobe 18 x 14 mm with question additional subpleural nodule posterior LEFT upper lobe 10 mm greatest size. Underlying emphysematous changes without additional mass or adenopathy. Electronically Signed   By: Lavonia Dana M.D.   On: 01/21/2016 09:48    EKG:   Orders placed or performed during the hospital encounter of 01/18/16  . ED EKG  . ED EKG  . EKG 12-Lead  . EKG 12-Lead    ASSESSMENT AND PLAN:   75 year old male  with past medical history significant for asthma, hypertension presents to the hospital secondary to weight loss, abdominal pain and constipation going on for almost 4 months now.  #1 Hypercalcemia-hypercalcemia is likely secondary to underlying malignancy. -Receiving IV fluids. Decrease rate today -Started calcitonin as well. Zometa given 01/19/2016. Also steroids were added -monitor -Hypokalemia is being replaced appropriately  #2 abdominal mass-20 cm x 20 cm abdominal mass of unknown primary. Possible underlying malignancy -High suspicion for lymphoma. LDH is elevated, as is uric acid. - Oncology has been consulted. - CT guided biopsy done today. - CT chest ordered as part of work up- no suspicious masses seen -Further workup per oncology.   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 -, ECHO ordered. Showing LVH, EF is [id number], mild diastolic dysfunction noted. Surgery scheduled on 01/31/16 outpatient  #4 asthma/COPD-continue inhalers and nebulizers as needed  #5 Hypertension-hold lisinopril as BP low normal  #6 DVT prophylaxis-on Lovenox-   #7 Leukocytosis-differential with 90% neutrophils. Chest x-ray showing lingular infiltrate. CAP Started on Rocephin and azithromycin. UA without any underlying infection - f/u wbc tomorrow, also elevated from steroids  Physical therapy consulted for weakness- recommended SNF at discharge     All the records are reviewed and case discussed with Care Management/Social Workerr. Management plans discussed with the  patient, family and they are in agreement.  CODE STATUS: DNR  TOTAL TIME TAKING CARE OF THIS PATIENT: 39 minutes. Marland Kitchen   Gladstone Lighter  M.D on 01/22/2016 at 1:19 PM  Between 7am to 6pm - Pager - [phone]  After 6pm go to [email or web address] - password [email or web address] [facility]  Office  [phone]  CC: Primary care physician; No PCP Per Patient

## 2016-01-22 NOTE — Progress Notes (Signed)
[facility]  Daily Progress Note   Subjective  - * No surgery found *  Patient somewhat somnolent after his biopsy earlier today. No new complaints. Calcium is better today but still elevated  Objective Vitals:   01/22/16 1019 01/22/16 1020 01/22/16 1050 01/22/16 1506  BP:  119/69 130/70 123/78  Pulse: 82   98  Resp: 18   18  Temp:    98.2 F (36.8 C)  TempSrc:      SpO2: 93%   98%  Weight:      Height:        Intake/Output Summary (Last 24 hours) at 01/22/16 1555 Last data filed at 01/22/16 0913  Gross per 24 hour  Intake          2226.17 ml  Output             3725 ml  Net         -1498.83 ml    PULM  CTAB CV  RRR VASC  Aorta easily palpable and enlarged  Laboratory CBC    Component Value Date/Time   WBC 20.8 (H) 01/21/2016 0517   HGB 11.1 (L) 01/21/2016 0517   HCT 34.8 (L) 01/21/2016 0517   PLT 342 01/21/2016 0517    BMET    Component Value Date/Time   NA 139 01/22/2016 0532   K 4.4 01/22/2016 0532   CL 100 (L) 01/22/2016 0532   CO2 30 01/22/2016 0532   GLUCOSE 91 01/22/2016 0532   BUN 26 (H) 01/22/2016 0532   CREATININE 0.85 01/22/2016 0532   CALCIUM 11.5 (H) 01/22/2016 0532   GFRNONAA >60 01/22/2016 0532   GFRAA >60 01/22/2016 0532    Assessment/Planning:    6.5 cm infrarenal abdominal aortic aneurysm.  Also with a large mass that is worrisome for a gastric lymphoma. Biopsy was done today.  In general, would recommend treatment of this large aneurysm prior to any chemotherapy or surgery due to the risk of possible rupture with the initiation of these therapies. If he has a rupture of this aneurysm he would almost certainly expire.  He is on the schedule for next Wednesday, November 15 for an abdominal aortic aneurysm repair with a stent graft. Within 2-3 weeks of this procedure, he could likely proceed with any treatment for his mass that is required.  I had a long discussion today about the procedure. We discussed  the reason and rationale for repair of aneurysms. The patient and his wife are agreeable to proceed.    Leotis Pain  01/22/2016, 3:55 PM

## 2016-01-22 NOTE — Consult Note (Signed)
Chief Complaint: Patient was seen in consultation today for  Chief Complaint  Patient presents with  . Weakness   at the request of * No referring provider recorded for this case *  Referring Physician(s): * No referring provider recorded for this case *  Supervising Physician: Marybelle Killings  Patient Status: [facility] - In-pt  History of Present Illness: Brian Hughes is a 75 y.o. male who presented with back and abdominal pain. He was found to have a mass growing out of his spleen worrisome for lymphoma.  Past Medical History:  Diagnosis Date  . Asthma   . Cancer ([place])   . Hypertension     No past surgical history on file.  Allergies: Bupropion; Flunisolide; Lisinopril; Mometasone; Terazosin; and Valsartan  Medications: Prior to Admission medications   Medication Sig Start Date End Date Taking? Authorizing Provider  lisinopril-hydrochlorothiazide (PRINZIDE,ZESTORETIC) 10-12.5 MG tablet Take 1 tablet by mouth daily. 11/15/15  Yes Historical Provider, MD  pantoprazole (PROTONIX) 40 MG tablet Take 1 tablet by mouth daily. 11/15/15  Yes Historical Provider, MD  simethicone (GAS-X) 80 MG chewable tablet Chew 1 tablet (80 mg total) by mouth 3 (three) times daily. 12/22/15 12/21/16 Yes Harvest Dark, MD  sucralfate (CARAFATE) 1 g tablet Take 1 tablet by mouth daily. 12/13/15  Yes Historical Provider, MD  VENTOLIN HFA 108 (90 Base) MCG/ACT inhaler Inhale 1-2 puffs into the lungs every 6 (six) hours as needed.  10/15/15  Yes Historical Provider, MD     Family History  Problem Relation Age of Onset  . Jaundice Father     Social History   Social History  . Marital status: Widowed    Spouse name: N/A  . Number of children: N/A  . Years of education: N/A   Social History Main Topics  . Smoking status: Never Smoker  . Smokeless tobacco: Never Used  . Alcohol use No  . Drug use: Unknown  . Sexual activity: Not Asked   Other Topics Concern  . None   Social History  Narrative  . None      Review of Systems: A 12 point ROS discussed and pertinent positives are indicated in the HPI above.  All other systems are negative.  Review of Systems  Vital Signs: BP (!) 147/80 (BP Location: Left Arm)   Pulse 93   Temp 98 F (36.7 C) (Oral)   Resp 20   Ht 6\' 4"  (1.93 m)   Wt 222 lb 8 oz (100.9 kg)   SpO2 91%   BMI 27.08 kg/m   Physical Exam  Constitutional: He is oriented to person, place, and time. He appears well-developed and well-nourished.  HENT:  Head: Normocephalic and atraumatic.  Cardiovascular: Normal rate and regular rhythm.   Pulmonary/Chest: Effort normal and breath sounds normal.  Neurological: He is alert and oriented to person, place, and time.  He is oriented to year, place, situation, person.  Skin: Skin is warm and dry.    Mallampati Score:   2  Imaging: Dg Chest 2 View  Result Date: 01/18/2016 CLINICAL DATA:  Pt says he has been weak and unable to walk and be steady on his feet for about 3 weeks. Hx of asthma. Former smoker EXAM: CHEST  2 VIEW COMPARISON:  12/21/2015 FINDINGS: Heart size is normal. There is patchy infiltrate in the lingula consistent with infectious process. No pulmonary edema. No pleural effusions. Mild bronchitic changes are present. IMPRESSION: 1. Bronchitic changes. 2. Infiltrate in the lingula. Electronically Signed  By: Nolon Nations M.D.   On: 01/18/2016 19:31   Ct Head Wo Contrast  Result Date: 01/18/2016 CLINICAL DATA:  75 year old male with abdominal pain, large abdominal tumor, increased weakness. Falls and inability to walk. Initial encounter. EXAM: CT HEAD WITHOUT CONTRAST TECHNIQUE: Contiguous axial images were obtained from the base of the skull through the vertex without intravenous contrast. COMPARISON:  CT Abdomen and Pelvis from today reported separately. 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 No midline shift,  ventriculomegaly, mass effect, parenchymal mass lesion, intracranial hemorrhage or evidence of cortically based acute infarction. Gray-white matter differentiation is within normal limits throughout the brain. Cerebral volume is within normal limits for age. Vascular: Calcified atherosclerosis at the skull base. Skull: No osseous abnormality identified. Sinuses/Orbits: Visualized paranasal sinuses and mastoids are well pneumatized. Right maxillary periosteal thickening. Other: No acute orbit or scalp soft tissue finding. IMPRESSION: Essentially normal for age noncontrast CT appearance of the brain. Note that early metastatic disease to the brain is difficult to exclude in the absence of intravenous contrast, and Brain MRI without and with contrast would best stage the brain as necessary. Electronically Signed   By: Genevie Ann M.D.   On: 01/18/2016 20:40   Ct Chest W Contrast  Result Date: 01/21/2016 CLINICAL DATA:  Asthma, hypertension, weight loss, abdominal pain and constipation, 20 x 20 cm abdominal mass of unknown primary possible malignancy, question lymphoma, elevated LDH EXAM: CT CHEST WITH CONTRAST TECHNIQUE: Multidetector CT imaging of the chest was performed during intravenous contrast administration. Sagittal and coronal MPR images reconstructed from axial data set. CONTRAST:  32mL ISOVUE-300 IOPAMIDOL (ISOVUE-300) INJECTION 61% IV COMPARISON:  None; correlation CT abdomen and pelvis 01/18/2016 FINDINGS: Cardiovascular: Atherosclerotic calcifications aorta, proximal great vessels, and coronary arteries. Thoracic vascular structures grossly patent on nondedicated exam. No pericardial effusion. Mediastinum/Nodes: No thoracic adenopathy. Air-filled esophagus throughout its length. Base of cervical region unremarkable Lungs/Pleura: Small BILATERAL pleural effusions. Compressive atelectasis BILATERAL lower lobes. Question RIGHT lower lobe nodule 18 x 14 mm image 89. Mild peribronchial thickening. Underlying  emphysematous changes particularly of the upper lobes. Questionable subpleural nodule posterior LEFT upper lobe 10 mm diameter image 46 versus atelectasis. Upper Abdomen: Large LEFT upper quadrant mass contiguous with stomach and spleen as well as tail of pancreas and upper abdominal adenopathy, better delineated on recent CT abdomen/pelvis exam. Small amount of free fluid in upper abdomen. Musculoskeletal: No acute osseous findings. IMPRESSION: Small BILATERAL pleural effusions. Large LEFT upper quadrant mass adenopathy as reported on CT abdomen exam. Subpleural nodule RIGHT lower lobe 18 x 14 mm with question additional subpleural nodule posterior LEFT upper lobe 10 mm greatest size. Underlying emphysematous changes without additional mass or adenopathy. Electronically Signed   By: Lavonia Dana M.D.   On: 01/21/2016 09:48   Ct Abdomen Pelvis W Contrast  Addendum Date: 01/18/2016   ADDENDUM REPORT: 01/18/2016 20:46 ADDENDUM: Study discussed by telephone with Dr. Eula Listen on 01/18/2016 at 20:45 . Electronically Signed   By: Genevie Ann M.D.   On: 01/18/2016 20:46   Result Date: 01/18/2016 CLINICAL DATA:  75 year old male with bilateral lower abdominal pain. Increased weakness. Falls. Inability to walk. Initial encounter. EXAM: CT ABDOMEN AND PELVIS WITH CONTRAST TECHNIQUE: Multidetector CT imaging of the abdomen and pelvis was performed using the standard protocol following bolus administration of intravenous contrast. CONTRAST:  12mL ISOVUE-300 IOPAMIDOL (ISOVUE-300) INJECTION 61% COMPARISON:  Chest and abdominal radiographic series 10517. FINDINGS: Lower chest: No  pericardial or pleural effusion. Lower lobe subpleural reticular opacity. No upper abdominal free air. Hepatobiliary: Negative liver. Surgically absent gallbladder. No intra or extrahepatic biliary duct enlargement. Pancreas/Spleen: There is a large heterogeneously enhancing solid mass occupying the left upper quadrant and lesser sac  inseparable from the spleen, the proximal stomach, left adrenal gland, and the pancreatic body and tail. The mass encompasses 19.7 x 19.6 x 16 cm (AP by transverse by CC). The splenic flexure appears to be inferiorly displaced along the anterior and caudal aspect of the large tumor. There is mass effect on the proximal gastric lumen. The pancreatic head and uncinate process are unaffected and appear normal. There is superimposed extensive gastrohepatic ligament, retrocrural, and celiac axis lymphadenopathy, with individual nodal enlargement up to 3.6 cm short axis. New numerous small but increased in number mesenteric nodes throughout the root of the mesenteric. Adrenals/Urinary Tract: The left adrenal gland is involved as above. The right adrenal gland is normal. Bilateral renal enhancement and post contrast excretion is within normal limits. Stomach/Bowel: Abnormal proximal stomach as above. Oral contrast was administered and has reached the distal small bowel, but not yet reached the terminal ileum. The duodenum and proximal small bowel loops are not directly involved by the large tumor. As stated above, the splenic flexure appears displaced and not directly involved. There is widespread diverticulosis of the colon. The large bowel is largely decompressed. No dilated small bowel. Vascular/Lymphatic: Large up to 6.4 cm diameter infrarenal abdominal aortic aneurysm with prominent mural plaque or thrombus. See series 2, image 54 and sagittal image 64. Calcified aortic atherosclerosis. Major arterial structures in the abdomen and pelvis remain patent. 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 The portal venous system is patent. Extensive upper abdominal lymphadenopathy as stated above. Reproductive: Penile implant. Other: Small volume pelvic free fluid. Unremarkable urinary bladder. Musculoskeletal: No acute or suspicious osseous lesion identified. IMPRESSION: 1. Large, nearly 20 cm  heterogeneously enhancing and partially cystic or necrotic soft tissue mass inseparable from the proximal stomach, spleen, body and tail of the pancreas, and left adrenal gland. Extensive upper abdominal and retrocrural lymphadenopathy. No evidence of liver metastatic disease or biliary obstruction. Top differential considerations include Lymphoma, GIST, other gastric carcinoma, other primary splenic malignancy, and less likely pancreatic carcinoma. 2. Large up to 6.4 cm infrarenal abdominal aortic aneurysm. No evidence of acute rupture. Vascular surgery consultation recommended due to increased risk of rupture for AAA >5.5 cm. This recommendation follows ACR consensus guidelines: White Paper of the ACR Incidental Findings Committee II on Vascular Findings. J Am Coll Radiol 2013; 10:789-794. 3. 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 Electronically Signed: By: Genevie Ann M.D. On: 01/18/2016 20:37    Labs:  CBC:  Recent Labs  01/18/16 1555 01/19/16 0332 01/20/16 0414 01/21/16 0517  WBC 18.5* 17.2* 21.0* 20.8*  HGB 12.4* 12.6* 12.2* 11.1*  HCT 36.8* 38.6* 36.8* 34.8*  PLT 404 358 395 342    COAGS:  Recent Labs  01/22/16 0757  INR 1.24  APTT 41*    BMP:  Recent Labs  01/19/16 0332 01/20/16 0414 01/21/16 0517 01/22/16 0532  NA 139 141 139 139  K 3.1* 3.6 4.4 4.4  CL 92* 98* 103 100*  CO2 38* 36* 29 30  GLUCOSE 76 100* 111* 91  BUN 21* 24* 25* 26*  CALCIUM 13.5* 14.3* 12.0* 11.5*  CREATININE 0.92  0.95 0.96 0.85  GFRNONAA >60 >60 >60 >60  GFRAA >60 >60 >60 >60    LIVER FUNCTION TESTS:  Recent Labs  12/21/15 1759 01/18/16 1911  BILITOT 0.7 0.9  AST 15 23  ALT 8* 10*  ALKPHOS 80 72  PROT 7.1 6.9  ALBUMIN 3.0* 2.5*    TUMOR MARKERS: No results for input(s): AFPTM, CEA, CA199, CHROMGRNA in the last 8760 hours.  Assessment and Plan:  Abdominal  mass. CT biopsy to follow.  Thank you for this interesting consult.  I greatly enjoyed meeting Brian Hughes and look forward to participating in their care.  A copy of this report was sent to the requesting provider on this date.  Electronically Signed: Mendi Constable, ART A 01/22/2016, 9:56 AM   I spent a total of 40 Minutes    in face to face in clinical consultation, greater than 50% of which was counseling/coordinating care for CT guided mass biopsy.

## 2016-01-22 NOTE — Progress Notes (Signed)
Pt had  Ct guided biopsy today dressing remains d/t to  Mid  Back with bandaide. Prn x 3 today   For c/o  Mid  Upper abd pain. Gets  Relief for a while then pain returns and pt calls for pain med again. Dilaudid works better for pt.

## 2016-01-22 NOTE — Plan of Care (Signed)
Problem: Pain Managment: Goal: General experience of comfort will improve Outcome: Progressing See note

## 2016-01-22 NOTE — Progress Notes (Signed)
Chaplain was making his rounds and visited with pt in room 124. Provided the ministry of prayer and scripture reading.    01/22/16 1235  Clinical Encounter Type  Visited With Patient  Visit Type Initial;Spiritual support  Referral From Nurse  Spiritual Encounters  Spiritual Needs Sacred text;Prayer

## 2016-01-22 NOTE — Procedures (Signed)
L abdominal mass biopsy 18 g core times 4 No comp/EBL

## 2016-01-23 DIAGNOSIS — E790 Hyperuricemia without signs of inflammatory arthritis and tophaceous disease: Secondary | ICD-10-CM

## 2016-01-23 LAB — BASIC METABOLIC PANEL
Anion gap: 7 (ref 5–15)
BUN: 20 mg/dL (ref 6–20)
CALCIUM: 10.8 mg/dL — AB (ref 8.9–10.3)
CO2: 31 mmol/L (ref 22–32)
CREATININE: 0.76 mg/dL (ref 0.61–1.24)
Chloride: 100 mmol/L — ABNORMAL LOW (ref 101–111)
GFR calc Af Amer: >60 mL/min (ref >60)
GFR calc non Af Amer: >60 mL/min (ref >60)
GLUCOSE: 85 mg/dL (ref 65–99)
Potassium: 4.3 mmol/L (ref 3.5–5.1)
Sodium: 138 mmol/L (ref 135–145)

## 2016-01-23 LAB — CBC
HEMATOCRIT: 36 % — AB (ref 40.0–52.0)
Hemoglobin: 11.5 g/dL — ABNORMAL LOW (ref 13.0–18.0)
MCH: 27.5 pg (ref 26.0–34.0)
MCHC: 31.9 g/dL — AB (ref 32.0–36.0)
MCV: 86.2 fL (ref 80.0–100.0)
PLATELETS: 323 10*3/uL (ref 150–440)
RBC: 4.18 MIL/uL — ABNORMAL LOW (ref 4.40–5.90)
RDW: 15.6 % — AB (ref 11.5–14.5)
WBC: 18.3 10*3/uL — ABNORMAL HIGH (ref 3.8–10.6)

## 2016-01-23 LAB — CULTURE, BLOOD (ROUTINE X 2)
CULTURE: NO GROWTH
Culture: NO GROWTH

## 2016-01-23 MED ORDER — ALLOPURINOL 100 MG PO TABS
300.0000 mg | ORAL_TABLET | Freq: Every day | ORAL | Status: DC
Start: 2016-01-23 — End: 2016-01-26
  Administered 2016-01-23 – 2016-01-26 (×3): 300 mg via ORAL
  Filled 2016-01-23 (×4): qty 3

## 2016-01-23 MED ORDER — CEFTRIAXONE SODIUM-DEXTROSE 1-3.74 GM-% IV SOLR
1.0000 g | INTRAVENOUS | Status: AC
  Administered 2016-01-24: 09:00:00 1 g via INTRAVENOUS
  Filled 2016-01-23: qty 50

## 2016-01-23 MED ORDER — SODIUM CHLORIDE 0.9 % IV SOLN
INTRAVENOUS | Status: DC
  Administered 2016-01-23 – 2016-01-31 (×12): via INTRAVENOUS

## 2016-01-23 MED ORDER — FENTANYL 12 MCG/HR TD PT72
12.5000 ug | MEDICATED_PATCH | TRANSDERMAL | Status: DC
  Administered 2016-01-23 – 2016-01-29 (×3): 12.5 ug via TRANSDERMAL
  Filled 2016-01-23 (×3): qty 1

## 2016-01-23 NOTE — Progress Notes (Signed)
Physical Therapy Treatment Patient Details Name: Brian Hughes MRN: [id number] DOB: 1940/10/19 Today's Date: 01/23/2016    History of Present Illness Pt admitted for complaints of weakness and abdominal pain. Per chart review, pt recently lost 40lbs in last 3 months. Pt with history of ashtma, HTN, and cancer. Pt with large AAA which appears to be stable at this time per MD. 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 Pt is very poor historian secondary to confusion..    PT Comments    Pt has lunch in front of him; pt notes he is finished. Pt has eaten about half and reports increased left abdominal pain post eating. Pt mildly confused throughout session requiring increased instruction for exercises. Pt requires assist for some exercises and verbal cueing throughout to stay on task. Pt lethargic and falls asleep several times. Pt refuses out of bed due to pain; due to level of lethargy and mild confusion, pt safest in bed at this time. Continue PT to progress strength, endurance and participation to promote improved functional mobility.   Follow Up Recommendations  SNF     Equipment Recommendations       Recommendations for Other Services       Precautions / Restrictions Precautions Precautions: Fall Restrictions Weight Bearing Restrictions: No    Mobility  Bed Mobility               General bed mobility comments: refused out of bed due to pain  Transfers                    Ambulation/Gait                 Stairs            Wheelchair Mobility    Modified Rankin (Stroke Patients Only)       Balance                                    Cognition Arousal/Alertness: Lethargic Behavior During Therapy:  (mild confusion throughout session) Overall Cognitive Status: No family/caregiver present to determine baseline cognitive functioning (difficulty staying on task; staying awake)       Memory: Decreased short-term memory               Exercises General Exercises - Lower Extremity Ankle Circles/Pumps: AROM;Both;10 reps;Supine Quad Sets: Strengthening;Both;10 reps;Supine Gluteal Sets: Other (comment) (attempted; unable to follow instruction) Short Arc Quad: AROM;Both;10 reps;Supine Heel Slides: AAROM;Both;10 reps;Supine Hip ABduction/ADduction: AAROM;Both;10 reps;Supine Straight Leg Raises: AAROM;Both;10 reps;Supine    General Comments        Pertinent Vitals/Pain Pain Assessment: Faces Pain Score: 8  Pain Location: L abdomen (notes pain increased because pt has just eaten) Pain Intervention(s): Limited activity within patient's tolerance    Home Living                      Prior Function            PT Goals (current goals can now be found in the care plan section) Progress towards PT goals: Progressing toward goals (slowly)    Frequency    Min 2X/week      PT Plan Current plan remains appropriate    Co-evaluation             End of Session           Time: [id number] PT Time Calculation (  min) (ACUTE ONLY): 20 min  Charges:  $Therapeutic Exercise: 8-22 mins                    G Codes:      Larae Grooms, PTA 01/23/2016, 2:34 PM

## 2016-01-23 NOTE — Progress Notes (Signed)
While rounding, [place] made initial visit to room 124. Pt appeared a bit confused and stated that he was in pain. Pt was attempting to give a history of his volunteering for various service organizations. His speech was rambling and hard to follow. I alerted the nurse of his request for additional pain medication. Nurse informed that they had just given medication and that she would check on him soon. CH provided the ministry of empathetic listening, prayer, and advocacy.  CH is available for follow up as needed.    01/23/16 1200  Clinical Encounter Type  Visited With Patient;Health care provider  Visit Type Initial;Spiritual support  Referral From Nurse  Spiritual Encounters  Spiritual Needs Prayer;Emotional

## 2016-01-23 NOTE — Progress Notes (Signed)
Nutrition Follow-up  DOCUMENTATION CODES:   Severe malnutrition in context of acute illness/injury  INTERVENTION:  1. Continue Ensure Enlive po TID, each supplement provides 350 kcal and 20 grams of protein  NUTRITION DIAGNOSIS:   Malnutrition related to acute illness as evidenced by percent weight loss, energy intake < or equal to 50% for > or equal to 5 days. -ongoing  GOAL:   Patient will meet greater than or equal to 90% of their needs -not meeting  MONITOR:   PO intake, Supplement acceptance, Weight trends  REASON FOR ASSESSMENT:   Malnutrition Screening Tool    ASSESSMENT:   Brian Hughes  is a 75 y.o. male with a known history of Asthma, hypertension presents to the emergency room complaining of many months of vague abdominal pain. He has had worsening weakness some on and off confusion as per his friend at bedside  Popped in on Brian Hughes, he is still not eating well. Skipped breakfast this morning "because I had company." He is consuming Ensure regularly "I love that." Had a lunch tray at bedside during visit, set it up for him so he could eat. He stated "I'm not going to eat a whole lot." Had grilled cheese and chicken noodle soup it appeared. Wt encounters do not appear accurate, large fluctuations. Meal Completion so far: 34% avg Labs and medications reviewed: Colace, Senokot NS @ 16mL/hr   Diet Order:  Diet regular Room service appropriate? Yes; Fluid consistency: Thin  Skin:  Reviewed, no issues  Last BM:  10/31  Height:   Ht Readings from Last 1 Encounters:  01/18/16 6\' 4"  (1.93 m)    Weight:   Wt Readings from Last 1 Encounters:  01/23/16 211 lb 11.2 oz (96 kg)    Ideal Body Weight:     BMI:  Body mass index is 25.77 kg/m.  Estimated Nutritional Needs:   Kcal:  [id number] kcals/d  Protein:  111-140 g/d  Fluid:  >/= 2.3 L/d  EDUCATION NEEDS:   No education needs identified at this time  Satira Anis. Delyla Sandeen, MS, RD  LDN Inpatient Clinical Dietitian Pager [phone]

## 2016-01-23 NOTE — Progress Notes (Signed)
[facility] NAME: Brian Hughes    MR#:  [id number]  DATE OF BIRTH:  10/12/1940  SUBJECTIVE:  CHIEF COMPLAINT:   Chief Complaint  Patient presents with  . Weakness   - alert and confused, has Significant abdominal pain from his mass. Mostly in the left lower quadrant radiating up to his left upper quadrant - calcium remains low  REVIEW OF SYSTEMS:  Review of Systems  Constitutional: Positive for malaise/fatigue and weight loss. Negative for chills and fever.  HENT: Negative for ear discharge, nosebleeds and tinnitus.   Eyes: Negative for blurred vision.  Respiratory: Negative for cough, shortness of breath and wheezing.   Cardiovascular: Positive for leg swelling. Negative for chest pain and palpitations.  Gastrointestinal: Positive for abdominal pain. Negative for constipation, diarrhea, nausea and vomiting.  Genitourinary: Negative for dysuria and urgency.  Musculoskeletal: Positive for joint pain. Negative for myalgias.  Skin: Negative for rash.  Neurological: Negative for dizziness, sensory change, speech change, focal weakness, seizures and headaches.  Psychiatric/Behavioral: Negative for depression.       Some confusion still present.    DRUG ALLERGIES:   Allergies  Allergen Reactions  . Bupropion Nausea Only and Nausea And Vomiting  . Flunisolide     Other reaction(s): Other (See Comments) Wheezing  . Lisinopril Cough  . Mometasone     Other reaction(s): Other (See Comments) Wheezing  . Terazosin     Other reaction(s): Other (See Comments) Fatigue  . Valsartan Cough    VITALS:  Blood pressure (!) 142/77, pulse 80, temperature 98 F (36.7 C), temperature source Oral, resp. rate 17, height 6\' 4"  (1.93 m), weight 96 kg (211 lb 11.2 oz), SpO2 97 %.  PHYSICAL EXAMINATION:  Physical Exam  GENERAL:  75 y.o.-year-old patient lying in the bed with no acute distress. More alert EYES: Pupils equal, round,  reactive to light and accommodation. No scleral icterus. Extraocular muscles intact.  HEENT: Head atraumatic, normocephalic. Oropharynx and nasopharynx clear.  NECK:  Supple, no jugular venous distention. No thyroid enlargement, no tenderness.  LUNGS: Normal breath sounds bilaterally, no wheezing, rales,rhonchi or crepitation. No use of accessory muscles of respiration. Diminished at the bases CARDIOVASCULAR: S1, S2 normal. No murmurs, rubs, or gallops.  ABDOMEN: Soft, tenderness in LUQ, abdomen appears distended. Bowel sounds present. No organomegaly or mass.  EXTREMITIES: No pedal edema, cyanosis, or clubbing.  NEUROLOGIC: Cranial nerves II through XII are intact. Muscle strength 5/5 in all extremities. Sensation intact. Gait not checked. Global weakness noted. PSYCHIATRIC: The patient is alert and oriented x 2. Intermittent confusion present. SKIN: No obvious rash, lesion, or ulcer.    LABORATORY PANEL:   CBC  Recent Labs Lab 01/23/16 0330  WBC 18.3*  HGB 11.5*  HCT 36.0*  PLT 323   ------------------------------------------------------------------------------------------------------------------  Chemistries   Recent Labs Lab 01/18/16 1911  01/23/16 0330  NA  --   < > 138  K  --   < > 4.3  CL  --   < > 100*  CO2  --   < > 31  GLUCOSE  --   < > 85  BUN  --   < > 20  CREATININE  --   < > 0.76  CALCIUM  --   < > 10.8*  MG 1.7  --   --   AST 23  --   --   ALT 10*  --   --   ALKPHOS 72  --   --  BILITOT 0.9  --   --   < > = values in this interval not displayed. ------------------------------------------------------------------------------------------------------------------  Cardiac Enzymes  Recent Labs Lab 01/18/16 1555  TROPONINI <0.03   ------------------------------------------------------------------------------------------------------------------  RADIOLOGY:  Ct Biopsy  Result Date: 01/22/2016 INDICATION: Left abdominal mass EXAM: CT BIOPSY  MEDICATIONS: None. ANESTHESIA/SEDATION: Fentanyl 50 mcg IV; Versed 1 mg IV Moderate Sedation Time:  10 The patient was continuously monitored during the procedure by the interventional radiology nurse under my direct supervision. FLUOROSCOPY TIME:  None COMPLICATIONS: None immediate. PROCEDURE: Informed written consent was obtained from the patient after a thorough discussion of the procedural risks, benefits and alternatives. All questions were addressed. Maximal Sterile Barrier Technique was utilized including caps, mask, sterile gowns, sterile gloves, sterile drape, hand hygiene and skin antiseptic. A timeout was performed prior to the initiation of the procedure. Under CT guidance, a(n) 17 gauge guide needle was advanced into the left upper quadrant mass via posterior approach. Subsequently four 18 gauge core biopsies were obtained. The guide needle was removed. Post biopsy images demonstrate no hemorrhage. Patient tolerated the procedure well without complication. Vital sign monitoring by nursing staff during the procedure will continue as patient is in the special procedures unit for post procedure observation. FINDINGS: The images document guide needle placement within the left upper quadrant mass via posterior approach. Post biopsy images demonstrate no hemorrhage. IMPRESSION: Successful CT-guided left upper quadrant mass core biopsy. Electronically Signed   By: Marybelle Killings M.D.   On: 01/22/2016 14:59    EKG:   Orders placed or performed during the hospital encounter of 01/18/16  . ED EKG  . ED EKG  . EKG 12-Lead  . EKG 12-Lead    ASSESSMENT AND PLAN:   75 year old male with past medical history significant for asthma, hypertension presents to the hospital secondary to weight loss, abdominal pain and constipation going on for almost 4 months now.  #1 Hypercalcemia-hypercalcemia is likely secondary to underlying malignancy. -Receiving IV fluids. Also on calcitonin -Zometa given 01/19/2016.  Received decadron as well 2 days ago -monitor. Improving calcium now.  #2 abdominal mass-20 cm x 20 cm abdominal mass of unknown primary. Possible underlying malignancy -High suspicion for lymphoma. LDH is elevated, as is uric acid. - Oncology has been consulted. - CT guided biopsy done. Recommend further surgery or chemo after fixing AAA - CT chest ordered as part of work up- no suspicious masses seen -Further workup per oncology.  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 -, ECHO ordered. Showing LVH, EF is [id number], mild diastolic dysfunction noted. Surgery scheduled on 01/31/16 outpatient  #4 asthma/COPD-continue inhalers and nebulizers as needed  #5 Hypertension-hold lisinopril as BP low normal  #6 DVT prophylaxis-on Lovenox-   #7 Leukocytosis-differential with 90% neutrophils. Chest x-ray showing lingular infiltrate. CAP on Rocephin and azithromycin. UA without any underlying infection  Physical therapy consulted for weakness- recommended SNF at discharge If pain controlled anticipate discharge tomorrow     All the records are reviewed and case discussed with Care Management/Social Workerr. Management plans discussed with the patient, family and they are in agreement.  CODE STATUS: DNR  TOTAL TIME TAKING CARE OF THIS PATIENT: 37 minutes. Marland Kitchen   Gladstone Lighter M.D on 01/23/2016 at 12:56 PM  Between 7am to 6pm - Pager - [phone]  After 6pm go to [email or web address] - password [email or web address] [facility]  Office  [phone]  CC: Primary care physician; No PCP  Per Patient

## 2016-01-23 NOTE — Plan of Care (Signed)
Problem: Pain Managment: Goal: General experience of comfort will improve Outcome: Not Progressing Patient still complaints of pain and discomforts to LLQ. PRN pain medicine administered. Needs attended.

## 2016-01-23 NOTE — Progress Notes (Signed)
Subjective: He is intermittently confused but much improved with agiatationa and retslessless  He does undertsnad that he is to have repai rof the enauerysm Pain is better controleld .  Objective: Vital signs in last 24 hours: Temp:  [97.7 F (36.5 C)-98 F (36.7 C)] 98 F (36.7 C) (11/07 1233) Pulse Rate:  [80-91] 91 (11/07 1412) Resp:  [17-24] 20 (11/07 1748) BP: (138-157)/(68-77) 142/77 (11/07 1233) SpO2:  [92 %-97 %] 94 % (11/07 1748) Weight:  [211 lb 11.2 oz (96 kg)] 211 lb 11.2 oz (96 kg) (11/07 0500)  Intake/Output from previous day: 11/06 0701 - 11/07 0700 In: 3827.8 [P.O.:240; I.V.:3537.8] Out: 4800 [Urine:4800] Intake/Output this shift: Total I/O In: 679 [I.V.:679] Out: 1750 [Urine:1750]  No extrmtiry edeam ,no cellultis, noacute distress  Results for orders placed or performed during the hospital encounter of 01/18/16 (from the past 24 hour(s))  CBC     Status: Abnormal   Collection Time: 01/23/16  3:30 AM  Result Value Ref Range   WBC 18.3 (H) 3.8 - 10.6 K/uL   RBC 4.18 (L) 4.40 - 5.90 MIL/uL   Hemoglobin 11.5 (L) 13.0 - 18.0 g/dL   HCT 36.0 (L) 40.0 - 52.0 %   MCV 86.2 80.0 - 100.0 fL   MCH 27.5 26.0 - 34.0 pg   MCHC 31.9 (L) 32.0 - 36.0 g/dL   RDW 15.6 (H) 11.5 - 14.5 %   Platelets 323 150 - 440 K/uL  Basic metabolic panel     Status: Abnormal   Collection Time: 01/23/16  3:30 AM  Result Value Ref Range   Sodium 138 135 - 145 mmol/L   Potassium 4.3 3.5 - 5.1 mmol/L   Chloride 100 (L) 101 - 111 mmol/L   CO2 31 22 - 32 mmol/L   Glucose, Bld 85 65 - 99 mg/dL   BUN 20 6 - 20 mg/dL   Creatinine, Ser 0.76 0.61 - 1.24 mg/dL   Calcium 10.8 (H) 8.9 - 10.3 mg/dL   GFR calc non Af Amer >60 >60 mL/min   GFR calc Af Amer >60 >60 mL/min   Anion gap 7 5 - 15    Studies/Results: Dg Chest 2 View  Result Date: 01/18/2016 CLINICAL DATA:  Pt says he has been weak and unable to walk and be steady on his feet for about 3 weeks. Hx of asthma. Former smoker EXAM:  CHEST  2 VIEW COMPARISON:  12/21/2015 FINDINGS: Heart size is normal. There is patchy infiltrate in the lingula consistent with infectious process. No pulmonary edema. No pleural effusions. Mild bronchitic changes are present. IMPRESSION: 1. Bronchitic changes. 2. Infiltrate in the lingula. Electronically Signed   By: Nolon Nations M.D.   On: 01/18/2016 19:31   Ct Head Wo Contrast  Result Date: 01/18/2016 CLINICAL DATA:  75 year old male with abdominal pain, large abdominal tumor, increased weakness. Falls and inability to walk. Initial encounter. EXAM: CT HEAD WITHOUT CONTRAST TECHNIQUE: Contiguous axial images were obtained from the base of the skull through the vertex without intravenous contrast. COMPARISON:  CT Abdomen and Pelvis from today reported separately. 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 No midline shift, ventriculomegaly, mass effect, parenchymal mass lesion, intracranial hemorrhage or evidence of cortically based acute infarction. Gray-white matter differentiation is within normal limits throughout the brain. Cerebral volume is within normal limits for age. Vascular: Calcified atherosclerosis at the skull base. Skull: No osseous abnormality identified. Sinuses/Orbits: Visualized paranasal sinuses and mastoids are well  pneumatized. Right maxillary periosteal thickening. Other: No acute orbit or scalp soft tissue finding. IMPRESSION: Essentially normal for age noncontrast CT appearance of the brain. Note that early metastatic disease to the brain is difficult to exclude in the absence of intravenous contrast, and Brain MRI without and with contrast would best stage the brain as necessary. Electronically Signed   By: Genevie Ann M.D.   On: 01/18/2016 20:40   Ct Chest W Contrast  Result Date: 01/21/2016 CLINICAL DATA:  Asthma, hypertension, weight loss, abdominal pain and constipation, 20 x 20 cm abdominal mass of  unknown primary possible malignancy, question lymphoma, elevated LDH EXAM: CT CHEST WITH CONTRAST TECHNIQUE: Multidetector CT imaging of the chest was performed during intravenous contrast administration. Sagittal and coronal MPR images reconstructed from axial data set. CONTRAST:  45mL ISOVUE-300 IOPAMIDOL (ISOVUE-300) INJECTION 61% IV COMPARISON:  None; correlation CT abdomen and pelvis 01/18/2016 FINDINGS: Cardiovascular: Atherosclerotic calcifications aorta, proximal great vessels, and coronary arteries. Thoracic vascular structures grossly patent on nondedicated exam. No pericardial effusion. Mediastinum/Nodes: No thoracic adenopathy. Air-filled esophagus throughout its length. Base of cervical region unremarkable Lungs/Pleura: Small BILATERAL pleural effusions. Compressive atelectasis BILATERAL lower lobes. Question RIGHT lower lobe nodule 18 x 14 mm image 89. Mild peribronchial thickening. Underlying emphysematous changes particularly of the upper lobes. Questionable subpleural nodule posterior LEFT upper lobe 10 mm diameter image 46 versus atelectasis. Upper Abdomen: Large LEFT upper quadrant mass contiguous with stomach and spleen as well as tail of pancreas and upper abdominal adenopathy, better delineated on recent CT abdomen/pelvis exam. Small amount of free fluid in upper abdomen. Musculoskeletal: No acute osseous findings. IMPRESSION: Small BILATERAL pleural effusions. Large LEFT upper quadrant mass adenopathy as reported on CT abdomen exam. Subpleural nodule RIGHT lower lobe 18 x 14 mm with question additional subpleural nodule posterior LEFT upper lobe 10 mm greatest size. Underlying emphysematous changes without additional mass or adenopathy. Electronically Signed   By: Lavonia Dana M.D.   On: 01/21/2016 09:48   Ct Abdomen Pelvis W Contrast  Addendum Date: 01/18/2016   ADDENDUM REPORT: 01/18/2016 20:46 ADDENDUM: Study discussed by telephone with Dr. Eula Listen on 01/18/2016 at 20:45 .  Electronically Signed   By: Genevie Ann M.D.   On: 01/18/2016 20:46   Result Date: 01/18/2016 CLINICAL DATA:  75 year old male with bilateral lower abdominal pain. Increased weakness. Falls. Inability to walk. Initial encounter. EXAM: CT ABDOMEN AND PELVIS WITH CONTRAST TECHNIQUE: Multidetector CT imaging of the abdomen and pelvis was performed using the standard protocol following bolus administration of intravenous contrast. CONTRAST:  112mL ISOVUE-300 IOPAMIDOL (ISOVUE-300) INJECTION 61% COMPARISON:  Chest and abdominal radiographic series 10517. FINDINGS: Lower chest: No pericardial or pleural effusion. Lower lobe subpleural reticular opacity. No upper abdominal free air. Hepatobiliary: Negative liver. Surgically absent gallbladder. No intra or extrahepatic biliary duct enlargement. Pancreas/Spleen: There is a large heterogeneously enhancing solid mass occupying the left upper quadrant and lesser sac inseparable from the spleen, the proximal stomach, left adrenal gland, and the pancreatic body and tail. The mass encompasses 19.7 x 19.6 x 16 cm (AP by transverse by CC). The splenic flexure appears to be inferiorly displaced along the anterior and caudal aspect of the large tumor. There is mass effect on the proximal gastric lumen. The pancreatic head and uncinate process are unaffected and appear normal. There is superimposed extensive gastrohepatic ligament, retrocrural, and celiac axis lymphadenopathy, with individual nodal enlargement up to 3.6 cm short axis. New numerous small but increased in number mesenteric nodes throughout the  root of the mesenteric. Adrenals/Urinary Tract: The left adrenal gland is involved as above. The right adrenal gland is normal. Bilateral renal enhancement and post contrast excretion is within normal limits. Stomach/Bowel: Abnormal proximal stomach as above. Oral contrast was administered and has reached the distal small bowel, but not yet reached the terminal ileum. The duodenum  and proximal small bowel loops are not directly involved by the large tumor. As stated above, the splenic flexure appears displaced and not directly involved. There is widespread diverticulosis of the colon. The large bowel is largely decompressed. No dilated small bowel. Vascular/Lymphatic: Large up to 6.4 cm diameter infrarenal abdominal aortic aneurysm with prominent mural plaque or thrombus. See series 2, image 54 and sagittal image 64. Calcified aortic atherosclerosis. Major arterial structures in the abdomen and pelvis remain patent. 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 The portal venous system is patent. Extensive upper abdominal lymphadenopathy as stated above. Reproductive: Penile implant. Other: Small volume pelvic free fluid. Unremarkable urinary bladder. Musculoskeletal: No acute or suspicious osseous lesion identified. IMPRESSION: 1. Large, nearly 20 cm heterogeneously enhancing and partially cystic or necrotic soft tissue mass inseparable from the proximal stomach, spleen, body and tail of the pancreas, and left adrenal gland. Extensive upper abdominal and retrocrural lymphadenopathy. No evidence of liver metastatic disease or biliary obstruction. Top differential considerations include Lymphoma, GIST, other gastric carcinoma, other primary splenic malignancy, and less likely pancreatic carcinoma. 2. Large up to 6.4 cm infrarenal abdominal aortic aneurysm. No evidence of acute rupture. Vascular surgery consultation recommended due to increased risk of rupture for AAA >5.5 cm. This recommendation follows ACR consensus guidelines: White Paper of the ACR Incidental Findings Committee II on Vascular Findings. J Am Coll Radiol 2013; 10:789-794. 3. 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 Electronically Signed: By: Genevie Ann M.D. On: 01/18/2016 20:37    Ct Biopsy  Result Date: 01/22/2016 INDICATION: Left abdominal mass EXAM: CT BIOPSY MEDICATIONS: None. ANESTHESIA/SEDATION: Fentanyl 50 mcg IV; Versed 1 mg IV Moderate Sedation Time:  10 The patient was continuously monitored during the procedure by the interventional radiology nurse under my direct supervision. FLUOROSCOPY TIME:  None COMPLICATIONS: None immediate. PROCEDURE: Informed written consent was obtained from the patient after a thorough discussion of the procedural risks, benefits and alternatives. All questions were addressed. Maximal Sterile Barrier Technique was utilized including caps, mask, sterile gowns, sterile gloves, sterile drape, hand hygiene and skin antiseptic. A timeout was performed prior to the initiation of the procedure. Under CT guidance, a(n) 17 gauge guide needle was advanced into the left upper quadrant mass via posterior approach. Subsequently four 18 gauge core biopsies were obtained. The guide needle was removed. Post biopsy images demonstrate no hemorrhage. Patient tolerated the procedure well without complication. Vital sign monitoring by nursing staff during the procedure will continue as patient is in the special procedures unit for post procedure observation. FINDINGS: The images document guide needle placement within the left upper quadrant mass via posterior approach. Post biopsy images demonstrate no hemorrhage. IMPRESSION: Successful CT-guided left upper quadrant mass core biopsy. Electronically Signed   By: Marybelle Killings M.D.   On: 01/22/2016 14:59    Scheduled Meds: . azithromycin  500 mg Oral Daily  . calcitonin (salmon)  1 spray Alternating Nares Daily  . [START ON 01/24/2016] cefTRIAXone  1 g Intravenous Q24H  . docusate sodium  100 mg  Oral BID  . enoxaparin (LOVENOX) injection  40 mg Subcutaneous Q24H  . feeding supplement (ENSURE ENLIVE)  237 mL Oral TID BM  . fentaNYL  12.5 mcg Transdermal Q72H  . mouth rinse  15 mL Mouth Rinse BID  . pantoprazole   40 mg Oral Daily  . senna  1 tablet Oral BID  . sodium chloride flush  3 mL Intravenous Q12H   Continuous Infusions: . sodium chloride 10 mL/hr at 01/22/16 0926  . sodium chloride 75 mL/hr at 01/23/16 0732   PRN Meds:acetaminophen **OR** acetaminophen, albuterol, bisacodyl, HYDROcodone-acetaminophen, HYDROmorphone (DILAUDID) injection, polyethylene glycol  Assessment/Plan:  Intraabdominal mass- biopsy done yesterday, awaiting results Hypercalcemia and hyperuricemia improving Kidney function is stable urine output is good  received Zometa, Decadron and calcitonin Will now d/c decadron, and reduce Iv fluids  Vascular surgery recommend repair of the aneurysm first due to high rsik fo rupture scheduled on 01/30/16  Begin allopurinol. Once pathology resulted, plan treatment, which may involve radiation.    LOS: 5 days   [employer]

## 2016-01-23 NOTE — Consult Note (Signed)
Reviewed pt chart for pre-op for AAA stent graft procedure. Pt with minimal coronary dz, echo with LVEF 65-70%. Labs OK except hypercalcemia which primary docs are addressing. No further workup appears necessary at this time as long as the hypercalcemia is controlled and stable prior to surgery.

## 2016-01-24 ENCOUNTER — Inpatient Hospital Stay: Admit: 2016-01-24 | Payer: Medicare Other | Admitting: Vascular Surgery

## 2016-01-24 ENCOUNTER — Inpatient Hospital Stay: Payer: Medicare Other | Admitting: Anesthesiology

## 2016-01-24 ENCOUNTER — Encounter
Admission: EM | Disposition: A | Discharge status: Hospice | Payer: Self-pay | Source: Home / Self Care | Attending: Internal Medicine

## 2016-01-24 ENCOUNTER — Other Ambulatory Visit (INDEPENDENT_AMBULATORY_CARE_PROVIDER_SITE_OTHER): Payer: Self-pay | Admitting: Vascular Surgery

## 2016-01-24 ENCOUNTER — Inpatient Hospital Stay: Payer: Medicare Other

## 2016-01-24 DIAGNOSIS — I714 Abdominal aortic aneurysm, without rupture: Secondary | ICD-10-CM

## 2016-01-24 HISTORY — PX: PERIPHERAL VASCULAR CATHETERIZATION: SHX172C

## 2016-01-24 LAB — BASIC METABOLIC PANEL
ANION GAP: 9 (ref 5–15)
BUN: 15 mg/dL (ref 6–20)
CALCIUM: 10.5 mg/dL — AB (ref 8.9–10.3)
CHLORIDE: 96 mmol/L — AB (ref 101–111)
CO2: 29 mmol/L (ref 22–32)
CREATININE: 0.7 mg/dL (ref 0.61–1.24)
GFR calc non Af Amer: >60 mL/min (ref >60)
GLUCOSE: 96 mg/dL (ref 65–99)
Potassium: 4.2 mmol/L (ref 3.5–5.1)
Sodium: 134 mmol/L — ABNORMAL LOW (ref 135–145)

## 2016-01-24 LAB — GLUCOSE, CAPILLARY
GLUCOSE-CAPILLARY: 92 mg/dL (ref 65–99)
Glucose-Capillary: 82 mg/dL (ref 65–99)

## 2016-01-24 LAB — MAGNESIUM: Magnesium: 1.7 mg/dL (ref 1.7–2.4)

## 2016-01-24 LAB — ABO/RH: ABO/RH(D): O POS

## 2016-01-24 LAB — PARATHYROID HORMONE, INTACT (NO CA): PTH: 6 pg/mL — ABNORMAL LOW (ref 15–65)

## 2016-01-24 SURGERY — ENDOVASCULAR REPAIR/STENT GRAFT
Anesthesia: General

## 2016-01-24 SURGERY — INSERTION, ENDOVASCULAR STENT GRAFT, AORTA, ABDOMINAL
Anesthesia: General

## 2016-01-24 MED ORDER — GLYCOPYRROLATE 0.2 MG/ML IJ SOLN
INTRAMUSCULAR | Status: DC | PRN
  Administered 2016-01-24: 0.4 mg via INTRAVENOUS

## 2016-01-24 MED ORDER — OXYCODONE HCL 5 MG PO TABS: 5.0000 mg | ORAL_TABLET | Freq: Once | ORAL | Status: DC | PRN

## 2016-01-24 MED ORDER — HEPARIN SODIUM (PORCINE) 1000 UNIT/ML IJ SOLN
INTRAMUSCULAR | Status: DC | PRN
  Administered 2016-01-24: 6000 [IU] via INTRAVENOUS
  Administered 2016-01-24: 2000 [IU] via INTRAVENOUS

## 2016-01-24 MED ORDER — LACTATED RINGERS IV SOLN
INTRAVENOUS | Status: DC | PRN
  Administered 2016-01-24: 10:00:00 via INTRAVENOUS

## 2016-01-24 MED ORDER — FENTANYL CITRATE (PF) 100 MCG/2ML IJ SOLN
INTRAMUSCULAR | Status: DC | PRN
  Administered 2016-01-24 (×2): 50 ug via INTRAVENOUS

## 2016-01-24 MED ORDER — PROPOFOL 10 MG/ML IV BOLUS
INTRAVENOUS | Status: DC | PRN
  Administered 2016-01-24: 160 mg via INTRAVENOUS
  Administered 2016-01-24: 20 mg via INTRAVENOUS

## 2016-01-24 MED ORDER — IOPAMIDOL (ISOVUE-300) INJECTION 61%
INTRAVENOUS | Status: DC | PRN
  Administered 2016-01-24: 50 mL via INTRAVENOUS

## 2016-01-24 MED ORDER — NEOSTIGMINE METHYLSULFATE 10 MG/10ML IV SOLN
INTRAVENOUS | Status: DC | PRN
  Administered 2016-01-24: 3 mg via INTRAVENOUS

## 2016-01-24 MED ORDER — KETAMINE HCL 50 MG/ML IJ SOLN
INTRAMUSCULAR | Status: DC | PRN
  Administered 2016-01-24: 25 mg via INTRAMUSCULAR
  Administered 2016-01-24: 25 mg via INTRAVENOUS

## 2016-01-24 MED ORDER — CHLORHEXIDINE GLUCONATE 0.12 % MT SOLN
15.0000 mL | Freq: Two times a day (BID) | OROMUCOSAL | Status: DC
  Administered 2016-01-24 – 2016-01-30 (×10): 15 mL via OROMUCOSAL
  Filled 2016-01-24 (×9): qty 15

## 2016-01-24 MED ORDER — FENTANYL CITRATE (PF) 100 MCG/2ML IJ SOLN
25.0000 ug | INTRAMUSCULAR | Status: DC | PRN
  Administered 2016-01-29 – 2016-01-31 (×2): 25 ug via INTRAVENOUS
  Filled 2016-01-24 (×2): qty 2

## 2016-01-24 MED ORDER — DEXTROSE 5 % IV SOLN
INTRAVENOUS | Status: DC | PRN
  Administered 2016-01-24: 1.5 g via INTRAVENOUS

## 2016-01-24 MED ORDER — PHENYLEPHRINE HCL 10 MG/ML IJ SOLN
INTRAMUSCULAR | Status: DC | PRN
  Administered 2016-01-24 (×9): 100 ug via INTRAVENOUS

## 2016-01-24 MED ORDER — EPHEDRINE SULFATE 50 MG/ML IJ SOLN
INTRAMUSCULAR | Status: DC | PRN
  Administered 2016-01-24: 10 mg via INTRAVENOUS

## 2016-01-24 MED ORDER — LIDOCAINE HCL (CARDIAC) 20 MG/ML IV SOLN
INTRAVENOUS | Status: DC | PRN
  Administered 2016-01-24: 50 mg via INTRAVENOUS

## 2016-01-24 MED ORDER — PROMETHAZINE HCL 25 MG/ML IJ SOLN: 6.2500 mg | INTRAMUSCULAR | Status: DC | PRN

## 2016-01-24 MED ORDER — ROCURONIUM BROMIDE 100 MG/10ML IV SOLN
INTRAVENOUS | Status: DC | PRN
  Administered 2016-01-24: 30 mg via INTRAVENOUS
  Administered 2016-01-24: 10 mg via INTRAVENOUS

## 2016-01-24 MED ORDER — OXYCODONE HCL 5 MG/5ML PO SOLN: 5.0000 mg | Freq: Once | ORAL | Status: DC | PRN

## 2016-01-24 MED ORDER — MIDAZOLAM HCL 2 MG/2ML IJ SOLN
INTRAMUSCULAR | Status: DC | PRN
  Administered 2016-01-24: 1 mg via INTRAVENOUS

## 2016-01-24 MED ORDER — ONDANSETRON HCL 4 MG/2ML IJ SOLN
INTRAMUSCULAR | Status: DC | PRN
Start: 2016-01-24 — End: 2016-01-24
  Administered 2016-01-24: 4 mg via INTRAVENOUS

## 2016-01-24 MED ORDER — SUCCINYLCHOLINE CHLORIDE 20 MG/ML IJ SOLN
INTRAMUSCULAR | Status: DC | PRN
  Administered 2016-01-24: 100 mg via INTRAVENOUS

## 2016-01-24 MED ORDER — MEPERIDINE HCL 25 MG/ML IJ SOLN
6.2500 mg | INTRAMUSCULAR | Status: DC | PRN
  Administered 2016-01-26: 12.5 mg via INTRAVENOUS
  Filled 2016-01-24: qty 1

## 2016-01-24 SURGICAL SUPPLY — 53 items
BLADE SURG 15 STRL LF DISP TIS (BLADE) ×1
BLADE SURG 15 STRL SS (BLADE) ×1
BOOT SUTURE AID YELLOW STND (SUTURE) ×2
BRUSH SCRUB 4% CHG (MISCELLANEOUS) ×2
CANNULA 5F STIFF (CANNULA) ×2
CATH ACCU-VU SIZ PIG 5F 70CM (CATHETERS) ×2
CATH BALLN CODA 9X100X32 (BALLOONS) ×2
CATH KA2 5FR 65CM (CATHETERS) ×2
DEVICE PRESTO INFLATION (MISCELLANEOUS) ×2
DRYSEAL FLEXSHEATH 12FR 33CM (SHEATH) ×1
DRYSEAL FLEXSHEATH 18FR 33CM (SHEATH) ×1
ELECT CAUTERY BLADE 6.4 (BLADE) ×2
ELECT REM PT RETURN 9FT ADLT (ELECTROSURGICAL) ×2
EXCLUDER TRUNK LEG 31MX14X15 (Endovascular Graft) ×2 IMPLANT
GLOVE BIO SURGEON STRL SZ7 (GLOVE) ×4
GLOVE BIO SURGEON STRL SZ8 (GLOVE) ×2
GOWN STRL REUS W/ TWL LRG LVL3 (GOWN DISPOSABLE) ×1
GOWN STRL REUS W/ TWL XL LVL3 (GOWN DISPOSABLE) ×2
GOWN STRL REUS W/TWL LRG LVL3 (GOWN DISPOSABLE) ×1
GOWN STRL REUS W/TWL XL LVL3 (GOWN DISPOSABLE) ×2
GRAFT EXCLUDER LEG 16X12 (Endovascular Graft) ×2 IMPLANT
GUIDEWIRE SUPER STIFF .035X180 (WIRE) ×4
GUIDEWIRE ZIPWIRE .035X260CM (WIRE) ×2
HEMOSTAT SURGICEL 2X3 (HEMOSTASIS) ×2
IV NS 1000ML (IV SOLUTION) ×1
IV NS 1000ML BAXH (IV SOLUTION) ×1
LEG CONTRALATERAL 16X20X13.5 (Vascular Products) ×1 IMPLANT
LEG CONTRALATERAL 23X12 (Endovascular Graft) ×2 IMPLANT
LIQUID BAND (GAUZE/BANDAGES/DRESSINGS) ×2
LOOP RED MAXI  1X406MM (MISCELLANEOUS) ×2
LOOP VESSEL MAXI 1X406 RED (MISCELLANEOUS) ×2
LOOP VESSEL MINI 0.8X406 BLUE (MISCELLANEOUS) ×1
LOOPS BLUE MINI 0.8X406MM (MISCELLANEOUS) ×1
PACK ANGIOGRAPHY (CUSTOM PROCEDURE TRAY) ×2
PACK BASIN MAJOR ARMC (MISCELLANEOUS) ×2
PERCLOSE PROGLIDE 6F (VASCULAR PRODUCTS) ×12
SHEATH BRITE TIP 6FRX11 (SHEATH) ×2
SHEATH BRITE TIP 8FRX11 (SHEATH) ×2
SHEATH DRYSEAL FLEX 12FR 33CM (SHEATH) ×1
SHEATH DRYSEAL FLEX 18FR 33CM (SHEATH) ×1
STENT GRAFT CONTRALAT 20X13.5 (Vascular Products) ×1 IMPLANT
SUT MNCRL 4-0 (SUTURE) ×1
SUT MNCRL 4-0 27XMFL (SUTURE) ×1
SUT PROLENE 6 0 BV (SUTURE) ×4
SUT SILK 2 0 (SUTURE) ×1
SUT SILK 2-0 18XBRD TIE 12 (SUTURE) ×1
SUT SILK 3 0 (SUTURE) ×1
SUT SILK 3-0 18XBRD TIE 12 (SUTURE) ×1
SUT SILK 4 0 (SUTURE) ×1
SUT SILK 4-0 18XBRD TIE 12 (SUTURE) ×1
SYR 20CC LL (SYRINGE) ×2
WIRE AMPLATZ SSTIFF .035X260CM (WIRE) ×4
WIRE J 3MM .035X145CM (WIRE) ×4

## 2016-01-24 NOTE — Op Note (Signed)
OPERATIVE NOTE   PROCEDURE: 1. US guidance for vascular access, bilateral femoral arteries 2. Catheter placement into aorta from bilateral femoral approaches 3. Placement of a 31 x 14 x 15 C3 Gore Excluder Endoprosthesis main body and a 16 x 12 x 7 contralateral limb 4. Placement of a 23 x 12 extender cuff on the right 5. Placement of a 20 x 14 extender cuff on the left 6. ProGlide closure devices bilateral femoral arteries  PRE-OPERATIVE DIAGNOSIS: AAA  POST-OPERATIVE DIAGNOSIS: same  SURGEON: Hortencia Pilar, MD and Leotis Pain, MD - Co-surgeons  ANESTHESIA: general  ESTIMATED BLOOD LOSS: 150 cc  FINDING(S): 1.  AAA  SPECIMEN(S):  none  INDICATIONS:   Brian Hughes is a 75 y.o. y.o. 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 The risks and benefits for repair with endovascular techniques were reviewed all questions were answered the patient has agreed to proceed with endograft repair of his abdominal aortic aneurysm..  DESCRIPTION: After obtaining full informed written consent, the patient was brought back to the operating room and placed supine upon the operating table.  The patient received IV antibiotics prior to induction.  After obtaining adequate anesthesia, the patient was prepped and draped in the standard fashion for endovascular AAA repair.  We then began by gaining access to both femoral arteries with US guidance with me working on the left and Dr. Lucky Cowboy working on the right.  The femoral arteries were found to be patent and accessed without difficulty with a needle under ultrasound guidance without difficulty on each side and permanent images were recorded.  We then placed 2 proglide devices on each side in a pre-close fashion and placed 8 French sheaths.   The patient was then given  7000 units of intravenous heparin.   The Pigtail catheter was placed into the aorta from the   right side. Using this image, we selected a 31 x 14 x 15 Main body device.  Over a stiff wire, an 24 French sheath was placed on the right side. The main body was then placed through the 18 French sheath. A Kumpe catheter was placed up the left side and a magnified image at the renal arteries was performed. The main body was then deployed just below the lowest renal artery. The Kumpe catheter was used to cannulate the contralateral gate without difficulty and successful cannulation was confirmed by twirling the pigtail catheter in the main body. We then placed a stiff wire and a retrograde arteriogram was performed through the left femoral sheath. We upsized the left sheath to the 12 French sheath for the contralateral limb and a 16 x 12 x 7 limb was selected and deployed. An additional extender was required based on the measurements obtained from the retrograde angiogram and a 20 x 14 contralateral limb was then placed.   The main body deployment was then completed. Based off the angiographic findings, extension limbs were necessary on the right as well.  A 23 x 12 extender cuff was then prepped delivered through the right sided sheath and deployed without difficulty. . All junction points and seals zones were treated with the compliant balloon.   The pigtail catheter was then replaced and a completion angiogram was performed.   No Endoleak was detected on completion angiography. The renal arteries were found to be widely patent. Both internal iliac arteries were preserved as well. . At this point we elected  to terminate the procedure. We secured the pro glide devices for hemostasis on the femoral arteries. The skin incision was closed with a 4-0 Monocryl. Dermabond and pressure dressing were placed. The patient was taken to the recovery room in stable condition having tolerated the procedure well.  COMPLICATIONS: none  CONDITION: stable  Katha Cabal  07/23/2014, 3:51 PM

## 2016-01-24 NOTE — H&P (Signed)
  Primary service contacted me today.  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  Was scheduled for next week but will add on for urgent repair today.

## 2016-01-24 NOTE — Progress Notes (Signed)
Pt left floor at this time for surgery.  Chg bath completed.  ivfs infusing.   No distress 02 2.5 l per [place].  Pain 6/10 now. To surgery by bed.

## 2016-01-24 NOTE — Anesthesia Preprocedure Evaluation (Addendum)
Anesthesia Evaluation  Patient identified by MRN, date of birth, ID band Patient awake    Reviewed: Allergy & Precautions, NPO status , Patient's Chart, lab work & pertinent test results  History of Anesthesia Complications Negative for: history of anesthetic complications  Airway Mallampati: II  TM Distance: >3 FB Neck ROM: Full    Dental  (+) Edentulous Upper, Edentulous Lower   Pulmonary asthma , neg sleep apnea, neg COPD,    breath sounds clear to auscultation- rhonchi (-) wheezing      Cardiovascular hypertension, Pt. on medications (-) CAD and (-) Past MI PERIPHERAL VASCULAR DISEASE: AAA.   Rhythm:Regular Rate:Normal - Systolic murmurs and - Diastolic murmurs Echo [id number]: - Left ventricle: The cavity size was normal. Wall thickness was   increased in a pattern of mild LVH. Systolic function was   vigorous. The estimated ejection fraction was in the range of 65%   to 70%. Doppler parameters are consistent with abnormal left   ventricular relaxation (grade 1 diastolic dysfunction). - Aortic valve: Mildly calcified annulus. Trileaflet; mildly   thickened leaflets. Valve area (VTI): 3.54 cm^2. Valve area   (Vmax): 3 cm^2. - Mitral valve: Mildly to moderately calcified annulus. Mildly   thickened leaflets . - Atrial septum: No defect or patent foramen ovale was identified. - Technically adequate study.   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    Endo/Other  negative endocrine ROSneg diabetes  Renal/GU negative Renal ROS     Musculoskeletal negative musculoskeletal ROS (+)   Abdominal (+) - obese,   Peds  Hematology negative hematology ROS (+)   Anesthesia Other Findings Past Medical History: No date: Asthma No date: Cancer ([place]) No date: Hypertension  Hypercalcemia on admission, improving, initially  14.8, now 10.5  Reproductive/Obstetrics                            Anesthesia Physical Anesthesia Plan  ASA: III  Anesthesia Plan: General   Post-op Pain Management:    Induction: Intravenous  Airway Management Planned: Oral ETT  Additional Equipment: Arterial line  Intra-op Plan:   Post-operative Plan: Extubation in OR  Informed Consent: I have reviewed the patients History and Physical, chart, labs and discussed the procedure including the risks, benefits and alternatives for the proposed anesthesia with the patient or authorized representative who has indicated his/her understanding and acceptance.   Dental advisory given  Plan Discussed with: CRNA and Anesthesiologist  Anesthesia Plan Comments: (Discussed with patient that we will plan to suspend DNR order during the perioperative period)       Anesthesia Quick Evaluation

## 2016-01-24 NOTE — Plan of Care (Signed)
Problem: Pain Managment: Goal: General experience of comfort will improve Pain improving 6/10 now verses  9-10/10 prev.  Problem: Skin Integrity: Goal: Risk for impaired skin integrity will decrease Outcome: Progressing Chg bath  For surgery this am  Problem: Activity: Goal: Risk for activity intolerance will decrease Outcome: Progressing More alert today.

## 2016-01-24 NOTE — Progress Notes (Signed)
Pt has remained alert and oriented with c/o mid, lower, sharp, 9/10 abdominal pain. Pt restless in the bed and moaning. PRN pain medication has been given. RR even and unlabored. Lung sounds clear/diminished to auscultation. o2 sats 91-95% on 3LNC. ST on cardiac monitor.  A-line in place R radial with appropriate waveform. BP WNL. Bilateral groin accessed with dermabond in place-no complications noted. Pt with poor appetite on clear liquid diet-pain seemed to have increased with PO intake. Will continue to monitor.

## 2016-01-24 NOTE — Anesthesia Postprocedure Evaluation (Signed)
Anesthesia Post Note  Patient: Brian Hughes  Procedure(s) Performed: Procedure(s) (LRB): Endovascular Repair/Stent Graft (N/A)  Patient location during evaluation: PACU Anesthesia Type: General Level of consciousness: awake and alert Pain management: pain level controlled Vital Signs Assessment: post-procedure vital signs reviewed and stable Respiratory status: spontaneous breathing, nonlabored ventilation, respiratory function stable and patient connected to face mask oxygen Cardiovascular status: blood pressure returned to baseline and stable Postop Assessment: no signs of nausea or vomiting Anesthetic complications: no    Last Vitals:  Vitals:   01/24/16 0947 01/24/16 1232  BP:  127/60  Pulse: 94 97  Resp: 20 13  Temp: 36.6 C 36.7 C    Last Pain:  Vitals:   01/24/16 0924  TempSrc:   PainSc: 6                  Johari Pinney

## 2016-01-24 NOTE — Progress Notes (Signed)
PT Cancellation Note  Patient Details Name: Brian Hughes MRN: [id number] DOB: Sep 16, 1940   Cancelled Treatment:    Reason Eval/Treat Not Completed: Patient at procedure or test/unavailable. Per chart review pt intubated and in surgery; spoke with nurse who notes pt will more than likely be moved to CCU post surgery. If so, pt will need new PT orders at that time.    Larae Grooms, PTA 01/24/2016, 11:24 AM

## 2016-01-24 NOTE — Care Management Important Message (Signed)
Important Message  Patient Details  Name: Brian Hughes MRN: [id number] Date of Birth: Jan 10, 1941   Medicare Important Message Given:  Yes    Shelbie Ammons, RN 01/24/2016, 8:34 AM

## 2016-01-24 NOTE — Transfer of Care (Signed)
Immediate Anesthesia Transfer of Care Note  Patient: Kelby Fam  Procedure(s) Performed: Procedure(s): Endovascular Repair/Stent Graft (N/A)  Patient Location: PACU  Anesthesia Type:General  Level of Consciousness: sedated  Airway & Oxygen Therapy: Patient Spontanous Breathing and Patient connected to face mask oxygen  Post-op Assessment: Report given to RN and Post -op Vital signs reviewed and stable  Post vital signs: Reviewed  Last Vitals:  Vitals:   01/24/16 0947 01/24/16 1232  BP:  127/60  Pulse: 94 97  Resp: 20 13  Temp: 36.6 C 36.7 C    Last Pain:  Vitals:   01/24/16 0924  TempSrc:   PainSc: 6       Patients Stated Pain Goal: 0 ([id number])  Complications: No apparent anesthesia complications

## 2016-01-24 NOTE — Anesthesia Procedure Notes (Signed)
Procedure Name: Intubation Performed by: Rolla Plate Pre-anesthesia Checklist: Patient identified, Patient being monitored, Timeout performed, Emergency Drugs available and Suction available Patient Re-evaluated:Patient Re-evaluated prior to inductionOxygen Delivery Method: Circle system utilized Preoxygenation: Pre-oxygenation with 100% oxygen Intubation Type: IV induction and Rapid sequence Ventilation: Mask ventilation without difficulty Laryngoscope Size: 3 and Miller Grade View: Grade I Tube type: Oral Tube size: 7.5 mm Number of attempts: 1 Airway Equipment and Method: Stylet Placement Confirmation: ETT inserted through vocal cords under direct vision,  positive ETCO2 and breath sounds checked- equal and bilateral Secured at: 22 cm Tube secured with: Tape Dental Injury: Teeth and Oropharynx as per pre-operative assessment

## 2016-01-24 NOTE — Progress Notes (Signed)
[facility]  Daily Progress Note   Subjective  - Day of Surgery  Patient resting quietly after surgery.  Pain reasonably well controlled  Objective Vitals:   01/24/16 1312 01/24/16 1330 01/24/16 1341 01/24/16 1349  BP: (!) 120/59   125/60  Pulse: (!) 101 97 (!) 101 99  Resp: 16 15 18  (!) 23  Temp:    98.3 F (36.8 C)  TempSrc:    Axillary  SpO2: 90% (!) 89% 90% 92%  Weight:    105.4 kg (232 lb 5.8 oz)  Height:    6\' 4"  (1.93 m)    Intake/Output Summary (Last 24 hours) at 01/24/16 1446 Last data filed at 01/24/16 1300  Gross per 24 hour  Intake             1939 ml  Output             2560 ml  Net             -621 ml    PULM  CTAB CV  RRR VASC  Feet warm, access site C/D/I  Laboratory CBC    Component Value Date/Time   WBC 18.3 (H) 01/23/2016 0330   HGB 11.5 (L) 01/23/2016 0330   HCT 36.0 (L) 01/23/2016 0330   PLT 323 01/23/2016 0330    BMET    Component Value Date/Time   NA 134 (L) 01/24/2016 0412   K 4.2 01/24/2016 0412   CL 96 (L) 01/24/2016 0412   CO2 29 01/24/2016 0412   GLUCOSE 96 01/24/2016 0412   BUN 15 01/24/2016 0412   CREATININE 0.70 01/24/2016 0412   CALCIUM 10.5 (H) 01/24/2016 0412   GFRNONAA >60 01/24/2016 0412   GFRAA >60 01/24/2016 0412    Assessment/Planning: POD #0 s/p endovascular AAA repair.   Seems to be doing well  Watch in ICU overnight and check labs in the am.  Likely out to the floor tomorrow.  Can do ASA and statin agent and not use Plavix or other anticoagulant if desired.  May be able to proceed with treatment for his mass next week if he seems to tolerate this well.  Biopsy results pending as of this am    Leotis Pain  01/24/2016, 2:46 PM

## 2016-01-24 NOTE — Progress Notes (Signed)
Patient seen after surgery, son and friend in room. Did well. Appreciate vascular input. Vitals stable.

## 2016-01-24 NOTE — Anesthesia Procedure Notes (Signed)
Anesthesia Procedure Note Right Radial aline 20 ga,  Site clean dry prepped with chlorhex. + waveform, neg hematoma, clear sterile drsg applied

## 2016-01-24 NOTE — Op Note (Signed)
OPERATIVE NOTE   PROCEDURE: 1. US guidance for vascular access, bilateral femoral arteries 2. Catheter placement into aorta from bilateral femoral approaches 3. Placement of a 31 mm proximal Gore Excluder Endoprosthesis main body right with a 14 mm x 7 cm contralateral limb 4. Left iliac artery bellbottom extension with 20 mm diameter x 14 cm length stent 5. Right iliac artery bellbottom extension with 23 mm diameter x 12 cm length stent 6. ProGlide closure devices bilateral femoral arteries  PRE-OPERATIVE DIAGNOSIS: AAA  POST-OPERATIVE DIAGNOSIS: same  SURGEON: Leotis Pain, MD and Hortencia Pilar, MD - Co-surgeons  ANESTHESIA: General  ESTIMATED BLOOD LOSS: 150 cc  FINDING(S): 1.  AAA  SPECIMEN(S):  none  INDICATIONS:   Brian Hughes is a 75 y.o. male who presents with a >6 cm AAA.  He also has a large gastric mass and needs the AAA fixed first before this can be addressed.  Risks and benefits were discussed.  DESCRIPTION: After obtaining full informed written consent, the patient was brought back to the operating room and placed supine upon the operating table.  The patient received IV antibiotics prior to induction.  After obtaining adequate anesthesia, the patient was prepped and draped in the standard fashion for endovascular AAA repair.  We then began by gaining access to both femoral arteries with US guidance with me working on the right and Dr. Delana Meyer working on the left.  The femoral arteries were found to be patent and accessed without difficulty with a needle under ultrasound guidance without difficulty on each side and permanent images were recorded.  We then placed 2 proglide devices on each side in a pre-close fashion and placed 8 French sheaths. The patient was then given  8000 units of intravenous heparin. The Pigtail catheter was placed into the aorta from the  Right side. Using this image, we selected a 31 mm diameter, 15 cm length Main body device.  Over a  stiff wire, an 21 French sheath was placed up the right. The main body was then placed through the 18 French sheath. A Kumpe catheter was placed up the left side and a magnified image at the renal arteries was performed. The main body was then deployed just below the lowest renal artery, which was the left. The Kumpe catheter was used to cannulate the contralateral gate without difficulty and successful cannulation was confirmed by twirling the pigtail catheter in the main body. We then placed a stiff wire and a retrograde arteriogram was performed through the left femoral sheath. We upsized to the 12 [place] sheath for the contralateral limb and a 14 mm diameter x 7 cm length limb was selected and deployed, knowing the length was too long for a single device and would need a bellbottom distally.  We then extended down to just above the hypogastric artery on the left using a 20 mm diameter x 14 cm length left iliac stent. The main body deployment was then completed. Based off the angiographic findings, extension limbs were necessary.  A 23 mm diameter x 12 cm stent was taken down to just above the hypogastric artery on the right iliac. All junction points and seals zones were treated with the compliant balloon. The pigtail catheter was then replaced and a completion angiogram was performed.   No Endoleak was detected on completion angiography. The renal arteries were found to be widely patent. The hypogastric arteries were patent . At this point we elected to terminate the procedure. We secured the pro  glide devices for hemostasis on the femoral arteries. The skin incision was closed with a 4-0 Monocryl. Dermabond and pressure dressing were placed. The patient was taken to the recovery room in stable condition having tolerated the procedure well.  COMPLICATIONS: none  CONDITION: stable  Leotis Pain  01/24/2016, 12:36 PM   This note was created with Dragon Medical transcription system. Any errors in dictation  are purely unintentional.

## 2016-01-24 NOTE — Progress Notes (Signed)
[place] at [place] NAME: Brian Hughes    MR#:  [id number]  DATE OF BIRTH:  1941/02/25  SUBJECTIVE:  CHIEF COMPLAINT:   Chief Complaint  Patient presents with  . Weakness   - Calcium remains at 10.5. Patient is on calcitonin. -Remains alert. Appreciate vascular consult. Patient will go for AAA repair today -Pathology from his recent biopsy of his left upper abdominal mass is still pending  REVIEW OF SYSTEMS:  Review of Systems  Constitutional: Positive for malaise/fatigue and weight loss. Negative for chills and fever.  HENT: Negative for ear discharge, nosebleeds and tinnitus.   Eyes: Negative for blurred vision.  Respiratory: Negative for cough, shortness of breath and wheezing.   Cardiovascular: Positive for leg swelling. Negative for chest pain and palpitations.  Gastrointestinal: Positive for abdominal pain and constipation. Negative for diarrhea, nausea and vomiting.  Genitourinary: Negative for dysuria and urgency.  Musculoskeletal: Positive for joint pain. Negative for myalgias.  Skin: Negative for rash.  Neurological: Negative for dizziness, sensory change, speech change, focal weakness, seizures and headaches.  Psychiatric/Behavioral: Negative for depression.       Some confusion still present.    DRUG ALLERGIES:   Allergies  Allergen Reactions  . Bupropion Nausea Only and Nausea And Vomiting  . Flunisolide     Other reaction(s): Other (See Comments) Wheezing  . Lisinopril Cough  . Mometasone     Other reaction(s): Other (See Comments) Wheezing  . Terazosin     Other reaction(s): Other (See Comments) Fatigue  . Valsartan Cough    VITALS:  Blood pressure 139/65, pulse 94, temperature 97.9 F (36.6 C), resp. rate 20, height 6\' 4"  (1.93 m), weight 101.2 kg (223 lb 1.6 oz), SpO2 93 %.  PHYSICAL EXAMINATION:  Physical Exam  GENERAL:  75 y.o.-year-old patient lying in the bed with no acute distress. More  alert EYES: Pupils equal, round, reactive to light and accommodation. No scleral icterus. Extraocular muscles intact.  HEENT: Head atraumatic, normocephalic. Oropharynx and nasopharynx clear.  NECK:  Supple, no jugular venous distention. No thyroid enlargement, no tenderness.  LUNGS: Normal breath sounds bilaterally, no wheezing, rales,rhonchi or crepitation. No use of accessory muscles of respiration. Diminished at the bases CARDIOVASCULAR: S1, S2 normal. No murmurs, rubs, or gallops.  ABDOMEN: Soft, tenderness in LUQ, abdomen appears distended. Hypoactive Bowel sounds present. No organomegaly or mass.  EXTREMITIES: No pedal edema, cyanosis, or clubbing.  NEUROLOGIC: Cranial nerves II through XII are intact. Muscle strength 5/5 in all extremities. Sensation intact. Gait not checked. Global weakness noted. PSYCHIATRIC: The patient is alert and oriented x 2. Intermittent confusion present. SKIN: No obvious rash, lesion, or ulcer.    LABORATORY PANEL:   CBC  Recent Labs Lab 01/23/16 0330  WBC 18.3*  HGB 11.5*  HCT 36.0*  PLT 323   ------------------------------------------------------------------------------------------------------------------  Chemistries   Recent Labs Lab 01/18/16 1911  01/24/16 0412  NA  --   < > 134*  K  --   < > 4.2  CL  --   < > 96*  CO2  --   < > 29  GLUCOSE  --   < > 96  BUN  --   < > 15  CREATININE  --   < > 0.70  CALCIUM  --   < > 10.5*  MG 1.7  --  1.7  AST 23  --   --   ALT 10*  --   --  ALKPHOS 72  --   --   BILITOT 0.9  --   --   < > = values in this interval not displayed. ------------------------------------------------------------------------------------------------------------------  Cardiac Enzymes  Recent Labs Lab 01/18/16 1555  TROPONINI <0.03   ------------------------------------------------------------------------------------------------------------------  RADIOLOGY:  Dg Abd 1 View  Result Date: 01/24/2016 CLINICAL  DATA:  Abdominal distention EXAM: ABDOMEN - 1 VIEW COMPARISON:  12/21/2015 FINDINGS: Distended loops of small and large bowel have developed. No obvious free intraperitoneal gas. No evidence of NG tube. IMPRESSION: Distention of small and large bowel has developed suggesting an ileus pattern. Electronically Signed   By: Marybelle Killings M.D.   On: 01/24/2016 09:25   Ct Biopsy  Result Date: 01/22/2016 INDICATION: Left abdominal mass EXAM: CT BIOPSY MEDICATIONS: None. ANESTHESIA/SEDATION: Fentanyl 50 mcg IV; Versed 1 mg IV Moderate Sedation Time:  10 The patient was continuously monitored during the procedure by the interventional radiology nurse under my direct supervision. FLUOROSCOPY TIME:  None COMPLICATIONS: None immediate. PROCEDURE: Informed written consent was obtained from the patient after a thorough discussion of the procedural risks, benefits and alternatives. All questions were addressed. Maximal Sterile Barrier Technique was utilized including caps, mask, sterile gowns, sterile gloves, sterile drape, hand hygiene and skin antiseptic. A timeout was performed prior to the initiation of the procedure. Under CT guidance, a(n) 17 gauge guide needle was advanced into the left upper quadrant mass via posterior approach. Subsequently four 18 gauge core biopsies were obtained. The guide needle was removed. Post biopsy images demonstrate no hemorrhage. Patient tolerated the procedure well without complication. Vital sign monitoring by nursing staff during the procedure will continue as patient is in the special procedures unit for post procedure observation. FINDINGS: The images document guide needle placement within the left upper quadrant mass via posterior approach. Post biopsy images demonstrate no hemorrhage. IMPRESSION: Successful CT-guided left upper quadrant mass core biopsy. Electronically Signed   By: Marybelle Killings M.D.   On: 01/22/2016 14:59    EKG:   Orders placed or performed during the hospital  encounter of 01/18/16  . ED EKG  . ED EKG  . EKG 12-Lead  . EKG 12-Lead    ASSESSMENT AND PLAN:   75 year old male with past medical history significant for asthma, hypertension presents to the hospital secondary to weight loss, abdominal pain and constipation going on for almost 4 months now.  #1 Hypercalcemia-hypercalcemia is secondary to underlying malignancy. -Receiving IV fluids. Also on calcitonin -Zometa given 01/19/2016. Received 1 dose of decadron over the weekend. -monitor. Improving calcium and stable at 10.5.  #2 Abdominal mass-20 cm x 20 cm abdominal mass of unknown primary. Possible underlying malignancy - LDH is elevated, as is uric acid. - Oncology has been consulted. CT guided biopsy done. Path results pending -- CT chest ordered as part of work up- no suspicious masses seen -Further workup per oncology.  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 -, ECHO ordered. Showing LVH, EF is 123456, mild diastolic dysfunction noted. Surgery scheduled for today  #6 DVT prophylaxis-on Lovenox-   #7 Leukocytosis-differential with 90% neutrophils. Chest x-ray showing lingular infiltrate. CAP on Rocephin and azithromycin. Treat for 5 days total. -UA without any underlying infection  Physical therapy consulted for weakness- recommended SNF at  discharge     All the records are reviewed and case discussed with Care Management/Social Workerr. Management plans discussed with the patient, family and they are in agreement.  CODE STATUS: DNR  TOTAL TIME TAKING CARE OF THIS PATIENT: 37 minutes. Marland Kitchen   Gladstone Lighter M.D on 01/24/2016 at 10:15 AM  Between 7am to 6pm - Pager - [phone]  After 6pm go to [email or web address] - password [email or web address] [facility]  Office  [phone]  CC: Primary care physician; No PCP Per Patient

## 2016-01-25 ENCOUNTER — Institutional Professional Consult (permissible substitution): Payer: Medicare Other | Admitting: Radiation Oncology

## 2016-01-25 ENCOUNTER — Encounter: Payer: Self-pay | Admitting: Vascular Surgery

## 2016-01-25 LAB — CBC
HCT: 35.2 % — ABNORMAL LOW (ref 40.0–52.0)
HEMOGLOBIN: 11.5 g/dL — AB (ref 13.0–18.0)
MCH: 27.8 pg (ref 26.0–34.0)
MCHC: 32.6 g/dL (ref 32.0–36.0)
MCV: 85.1 fL (ref 80.0–100.0)
Platelets: 350 10*3/uL (ref 150–440)
RBC: 4.14 MIL/uL — ABNORMAL LOW (ref 4.40–5.90)
RDW: 15.7 % — AB (ref 11.5–14.5)
WBC: 18.2 10*3/uL — ABNORMAL HIGH (ref 3.8–10.6)

## 2016-01-25 LAB — BASIC METABOLIC PANEL
ANION GAP: 8 (ref 5–15)
BUN: 14 mg/dL (ref 6–20)
CALCIUM: 10.4 mg/dL — AB (ref 8.9–10.3)
CO2: 30 mmol/L (ref 22–32)
Chloride: 98 mmol/L — ABNORMAL LOW (ref 101–111)
Creatinine, Ser: 0.72 mg/dL (ref 0.61–1.24)
GFR calc Af Amer: >60 mL/min (ref >60)
GLUCOSE: 103 mg/dL — AB (ref 65–99)
Potassium: 3.7 mmol/L (ref 3.5–5.1)
Sodium: 136 mmol/L (ref 135–145)

## 2016-01-25 MED ORDER — AMLODIPINE BESYLATE 5 MG PO TABS
5.0000 mg | ORAL_TABLET | Freq: Every day | ORAL | Status: DC
  Administered 2016-01-26 – 2016-01-30 (×5): 5 mg via ORAL
  Filled 2016-01-25 (×5): qty 1

## 2016-01-25 MED ORDER — BISACODYL 10 MG RE SUPP
10.0000 mg | Freq: Once | RECTAL | Status: AC
  Administered 2016-01-25: 10 mg via RECTAL
  Filled 2016-01-25: qty 1

## 2016-01-25 MED ORDER — SODIUM CHLORIDE 0.9 % IV SOLN
375.0000 mg/m2 | Freq: Once | INTRAVENOUS | Status: DC
  Filled 2016-01-25: qty 90

## 2016-01-25 MED ORDER — DEXAMETHASONE SODIUM PHOSPHATE 4 MG/ML IJ SOLN: 12.0000 mg | Freq: Once | INTRAMUSCULAR | Status: DC

## 2016-01-25 MED ORDER — DIPHENHYDRAMINE HCL 25 MG PO CAPS
50.0000 mg | ORAL_CAPSULE | Freq: Once | ORAL | Status: AC
  Administered 2016-01-29: 50 mg via ORAL
  Filled 2016-01-25: qty 2

## 2016-01-25 MED ORDER — METOPROLOL TARTRATE 5 MG/5ML IV SOLN
10.0000 mg | INTRAVENOUS | Status: DC | PRN
  Administered 2016-01-25: 5 mg via INTRAVENOUS
  Filled 2016-01-25: qty 10

## 2016-01-25 MED ORDER — FAMOTIDINE IN NACL 20-0.9 MG/50ML-% IV SOLN: 20.0000 mg | Freq: Once | INTRAVENOUS | Status: DC

## 2016-01-25 MED ORDER — POLYETHYLENE GLYCOL 3350 17 G PO PACK
17.0000 g | PACK | Freq: Every day | ORAL | Status: DC
  Administered 2016-01-26 – 2016-01-27 (×2): 17 g via ORAL
  Filled 2016-01-25 (×3): qty 1

## 2016-01-25 MED ORDER — SODIUM CHLORIDE 0.9 % IV SOLN
100.0000 mg/m2 | Freq: Every day | INTRAVENOUS | Status: AC
  Filled 2016-01-25: qty 20

## 2016-01-25 MED ORDER — HYDROCHLOROTHIAZIDE 12.5 MG PO CAPS: 12.5000 mg | ORAL_CAPSULE | Freq: Every day | ORAL | Status: DC

## 2016-01-25 MED ORDER — LISINOPRIL 10 MG PO TABS
10.0000 mg | ORAL_TABLET | Freq: Every day | ORAL | Status: DC
  Administered 2016-01-25 – 2016-01-30 (×6): 10 mg via ORAL
  Filled 2016-01-25 (×6): qty 1

## 2016-01-25 MED ORDER — ACETAMINOPHEN 325 MG PO TABS
650.0000 mg | ORAL_TABLET | Freq: Once | ORAL | Status: DC
  Filled 2016-01-25: qty 2

## 2016-01-25 MED ORDER — LACTULOSE 10 GM/15ML PO SOLN: 20.0000 g | Freq: Two times a day (BID) | ORAL | Status: DC | PRN

## 2016-01-25 NOTE — Progress Notes (Signed)
Patient transferred to new room by bed with Chasity, NT.  Patient on tele monitor and O2 for transport.

## 2016-01-25 NOTE — Progress Notes (Signed)
RN spoke with Dr. Lucky Cowboy and MD gave order to D/C aline and that it is okay for patient to move to the floor from vascular's stand point.

## 2016-01-25 NOTE — Progress Notes (Signed)
[facility]  Daily Progress Note   Subjective  - 1 Day Post-Op  Doing well.  Being transferred to the floor.  Minimal pain.  No events overnight  Objective Vitals:   01/25/16 1300 01/25/16 1400 01/25/16 1500 01/25/16 1628  BP: (!) 141/70 (!) 142/67 (!) 156/75 (!) 114/55  Pulse: 100 96 (!) 105 (!) 102  Resp: 18 20 (!) 21 17  Temp:    97.8 F (36.6 C)  TempSrc:    Oral  SpO2: 93% 96% 96% 93%  Weight:      Height:        Intake/Output Summary (Last 24 hours) at 01/25/16 1635 Last data filed at 01/25/16 1500  Gross per 24 hour  Intake             4360 ml  Output             2850 ml  Net             1510 ml    PULM  CTAB CV  RRR VASC  Access site C/D/I. Feet warm   Laboratory CBC    Component Value Date/Time   WBC 18.2 (H) 01/25/2016 0432   HGB 11.5 (L) 01/25/2016 0432   HCT 35.2 (L) 01/25/2016 0432   PLT 350 01/25/2016 0432    BMET    Component Value Date/Time   NA 136 01/25/2016 0432   K 3.7 01/25/2016 0432   CL 98 (L) 01/25/2016 0432   CO2 30 01/25/2016 0432   GLUCOSE 103 (H) 01/25/2016 0432   BUN 14 01/25/2016 0432   CREATININE 0.72 01/25/2016 0432   CALCIUM 10.4 (H) 01/25/2016 0432   GFRNONAA >60 01/25/2016 0432   GFRAA >60 01/25/2016 0432    Assessment/Planning: POD #1 s/p endovascular AAA repair   OK to go to floor.  Advancing diet OK by me  Stable for definitive surgery for his tumor next week.    Leotis Pain  01/25/2016, 4:35 PM

## 2016-01-25 NOTE — Progress Notes (Signed)
RN spoke with Dr. Tressia Miners and made MD aware that patient had run of SVT when getting up to bedside commode and that per Dr. Lucky Cowboy patient can be transferred to floor.  Dr. Tressia Miners acknowledged and gave order to transfer patient to 1C with tele monitor.

## 2016-01-25 NOTE — Progress Notes (Signed)
Spoke to patient about chemotherapy.  Pt stated that he did not want chemo today due to having surgery tomorrow per Dr Lucky Cowboy.  Per Dr Bunnie Domino note patient does not have surgery schedule tomorrow.  Explained to patient need for chemo and not having surgery tomorrow. Patient confused about why needing chemo and stating no one told him he had cancer.  Though patient is alert and oriented x 3.  He appears confused about topics when talking.  He rambles  about needing surgery but is adamant about not getting chemo today.  Spoke to Dr  Sherrine Maples  About situation she was going to call family.

## 2016-01-25 NOTE — Progress Notes (Signed)
Pt AO. NSR. Pt CO abdominal pain relieved with pressure provided by pillows and medications. When experiencing pain pt will desat into mid 80's but sats normally when pain is controlled. Currently on [facility]. A-line in place and zeroed. Hypertension medications from home are restarted and PRN Metoprolol in place to treat hypertension. Bilateral groin incisions WDL. Will continue to reassess.

## 2016-01-25 NOTE — Progress Notes (Addendum)
[place] at [place] NAME: Brian Hughes    MR#:  [id number]  DATE OF BIRTH:  09-Oct-1940  SUBJECTIVE:  CHIEF COMPLAINT:   Chief Complaint  Patient presents with  . Weakness   - s/p AAA repair yesterday. Doing well. Has abdominal pain and constipation - will be transferred to floor today  REVIEW OF SYSTEMS:  Review of Systems  Constitutional: Positive for malaise/fatigue and weight loss. Negative for chills and fever.  HENT: Negative for ear discharge, nosebleeds and tinnitus.   Eyes: Negative for blurred vision.  Respiratory: Negative for cough, shortness of breath and wheezing.   Cardiovascular: Positive for leg swelling. Negative for chest pain and palpitations.  Gastrointestinal: Positive for abdominal pain and constipation. Negative for diarrhea, nausea and vomiting.  Genitourinary: Negative for dysuria and urgency.  Musculoskeletal: Positive for joint pain. Negative for myalgias.  Skin: Negative for rash.  Neurological: Negative for dizziness, sensory change, speech change, focal weakness, seizures and headaches.  Psychiatric/Behavioral: Negative for depression.       Some confusion still present.    DRUG ALLERGIES:   Allergies  Allergen Reactions  . Bupropion Nausea Only and Nausea And Vomiting  . Flunisolide     Other reaction(s): Other (See Comments) Wheezing  . Lisinopril Cough  . Mometasone     Other reaction(s): Other (See Comments) Wheezing  . Terazosin     Other reaction(s): Other (See Comments) Fatigue  . Valsartan Cough    VITALS:  Blood pressure (!) 141/70, pulse 100, temperature 97.9 F (36.6 C), temperature source Axillary, resp. rate 18, height 6\' 4"  (1.93 m), weight 102.4 kg (225 lb 12 oz), SpO2 93 %.  PHYSICAL EXAMINATION:  Physical Exam  GENERAL:  75 y.o.-year-old patient lying in the bed with no acute distress. More alert EYES: Pupils equal, round, reactive to light and accommodation. No  scleral icterus. Extraocular muscles intact.  HEENT: Head atraumatic, normocephalic. Oropharynx and nasopharynx clear.  NECK:  Supple, no jugular venous distention. No thyroid enlargement, no tenderness.  LUNGS: Normal breath sounds bilaterally, no wheezing, rales,rhonchi or crepitation. No use of accessory muscles of respiration. Diminished at the bases CARDIOVASCULAR: S1, S2 normal. No murmurs, rubs, or gallops.  ABDOMEN: Soft, tenderness in LUQ, abdomen appears distended. Hypoactive Bowel sounds present. No organomegaly or mass.  EXTREMITIES: No pedal edema, cyanosis, or clubbing.  NEUROLOGIC: Cranial nerves II through XII are intact. Muscle strength 5/5 in all extremities. Sensation intact. Gait not checked. Global weakness noted. PSYCHIATRIC: The patient is alert and oriented x 2-3. Intermittent confusion present. SKIN: No obvious rash, lesion, or ulcer.    LABORATORY PANEL:   CBC  Recent Labs Lab 01/25/16 0432  WBC 18.2*  HGB 11.5*  HCT 35.2*  PLT 350   ------------------------------------------------------------------------------------------------------------------  Chemistries   Recent Labs Lab 01/18/16 1911  01/24/16 0412 01/25/16 0432  NA  --   < > 134* 136  K  --   < > 4.2 3.7  CL  --   < > 96* 98*  CO2  --   < > 29 30  GLUCOSE  --   < > 96 103*  BUN  --   < > 15 14  CREATININE  --   < > 0.70 0.72  CALCIUM  --   < > 10.5* 10.4*  MG 1.7  --  1.7  --   AST 23  --   --   --   ALT 10*  --   --   --  ALKPHOS 72  --   --   --   BILITOT 0.9  --   --   --   < > = values in this interval not displayed. ------------------------------------------------------------------------------------------------------------------  Cardiac Enzymes  Recent Labs Lab 01/18/16 1555  TROPONINI <0.03   ------------------------------------------------------------------------------------------------------------------  RADIOLOGY:  Dg Abd 1 View  Result Date: 01/24/2016 CLINICAL  DATA:  Abdominal distention EXAM: ABDOMEN - 1 VIEW COMPARISON:  12/21/2015 FINDINGS: Distended loops of small and large bowel have developed. No obvious free intraperitoneal gas. No evidence of NG tube. IMPRESSION: Distention of small and large bowel has developed suggesting an ileus pattern. Electronically Signed   By: Marybelle Killings M.D.   On: 01/24/2016 09:25    EKG:   Orders placed or performed during the hospital encounter of 01/18/16  . ED EKG  . ED EKG  . EKG 12-Lead  . EKG 12-Lead    ASSESSMENT AND PLAN:   75 year old male with past medical history significant for asthma, hypertension presents to the hospital secondary to weight loss, abdominal pain and constipation going on for almost 4 months now.  #1 Hypercalcemia-hypercalcemia is secondary to underlying malignancy. -Receiving IV fluids. Also on calcitonin- discontinue calcitonin now -Zometa given 01/19/2016. Received 1 dose of decadron over the weekend. -monitor. Improving calcium and stable at 10.5.  #2 Abdominal mass-20 cm x 20 cm abdominal mass of unknown primary. Possible underlying malignancy - LDH is elevated, as is uric acid. - Oncology has been consulted. CT guided biopsy done. Path results pending -- CT chest ordered as part of work up- no suspicious masses seen -Further workup per oncology.  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 -, ECHO ordered. Showing LVH, EF is 123456, mild diastolic dysfunction noted. -  S/P REPAIR ENDOVASCULAR DONE ON 01/24/16  #6 DVT prophylaxis-on Lovenox-  #7 Leukocytosis-differential with 90% neutrophils. Chest x-ray showing lingular infiltrate. CAP on Rocephin and azithromycin. Treat for 5 days total. -UA without any underlying infection  #8 HTN- started lisinopril, adjust meds as needed  Physical therapy consulted for weakness- recommended SNF at discharge reconsult tomorrow Discharge once able to work with physical therapy    All the records are reviewed and case discussed with Care Management/Social Workerr. Management plans discussed with the patient, family and they are in agreement.  CODE STATUS: DNR  TOTAL TIME TAKING CARE OF THIS PATIENT: 37 minutes. Marland Kitchen   Gladstone Lighter M.D on 01/25/2016 at 1:26 PM  Between 7am to 6pm - Pager - [phone]  After 6pm go to [email or web address] - password [email or web address] [facility]  Office  [phone]  CC: Primary care physician; No PCP Per Patient

## 2016-01-25 NOTE — Progress Notes (Signed)
PT Cancellation Note  Patient Details Name: Brian Hughes MRN: [id number] DOB: Oct 08, 1940   Cancelled Treatment:    Reason Eval/Treat Not Completed: Other (comment).  Pt sitting on bedside commode and not available for PT session.  Nursing preparing to call report to transfer pt to floor.  Will re-attempt PT re-eval tomorrow as medically appropriate.   Raquel Sarna Zacchary Pompei 01/25/2016, 8:14 AM Leitha Bleak, [place]

## 2016-01-25 NOTE — Treatment Plan (Signed)
Update on path report and treatment plan: Received call from Dr. 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 Flow cytometry is pending and further characterization of the lymphoma is to follow.  Given that the patient has undergone AAA repair and is stable and will be transferred out of ICU, I would like to begin IV Rituxan without any further delay with the plan to tailor the treatment regimen ( will most likely add CVP down the line).  Given the bulky size of the mass, he is highly likely to develop tumor lysis, therefore will start administer Rituxan in split dose, with tumor lysis prophylaxis.  I will discuss this with pharmacy and the hospitalist.

## 2016-01-25 NOTE — Consult Note (Signed)
NEW PATIENT EVALUATION  Name: Brian Hughes  MRN: [id number]  Date:   01/18/2016     DOB: Jul 24, 1940   This 75 y.o. male patient presents to the clinic for initial evaluation of large abdominal mass pulmonary diagnosis lymphoma.  REFERRING PHYSICIAN: No ref. provider found  CHIEF COMPLAINT:  Chief Complaint  Patient presents with  . Weakness    DIAGNOSIS: The primary encounter diagnosis was Hypokalemia. Diagnoses of Hypercalcemia, Generalized abdominal mass, Abdominal aortic aneurysm (AAA) without rupture (HCC), Aspiration pneumonia of left lung, unspecified aspiration pneumonia type, unspecified part of lung ([place]), Hypoxia, Muscle weakness (generalized), Unsteadiness on feet, Mass in the abdomen, Cancer ([place]), Abdominal mass, LUQ (left upper quadrant), Abdominal mass, Left upper quadrant abdominal mass, Abdominal distention, Constipation, and Non-Hodgkin lymphoma of intra-abdominal lymph nodes, unspecified non-Hodgkin lymphoma type [facility]) were also pertinent to this visit.   PREVIOUS INVESTIGATIONS:  CT scans reviewed Clinical notes reviewed Case discussed at cancer conference  HPI: Patient is a 75 year old male with multiple comorbidities including history of asthma hypertension presented with significant weight loss hypercalcemia and significant abdominal pain. CT scan on admission showed a 6.4 cm infrarenal aneurysm as well as a nearly 20 cm heterogeneous enhancing and partially cystic and necrotic soft tissue mass inseparable from the proximal spleen stomach body and tail of the pancreas. He also had extensive upper abdominal and retrocrural lymphadenopathy. 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 I've asked to evaluate the patient for possible palliative radiation. He is seen today in the CCU. He is on narcotic dependent pain for this abdominal mass. He is recently had placement of the core endoprosthesis in the main body of the aorta  which ulcers ultrasound guidance of his bilateral femoral arteries. He is fairly noncommunicative today although does state he is having significant abdominal pain.  PLANNED TREATMENT REGIMEN: Commencement of chemotherapy  PAST MEDICAL HISTORY:  has a past medical history of Asthma; Cancer ([place]); and Hypertension.    PAST SURGICAL HISTORY:  Past Surgical History:  Procedure Laterality Date  . PERIPHERAL VASCULAR CATHETERIZATION N/A 01/24/2016   Procedure: Endovascular Repair/Stent Graft;  Surgeon: Algernon Huxley, MD;  Location: [place] [facility];  Service: Cardiovascular;  Laterality: N/A;    FAMILY HISTORY: family history includes Jaundice in his father.  SOCIAL HISTORY:  reports that he has never smoked. He has never used smokeless tobacco. He reports that he does not drink alcohol.  ALLERGIES: Bupropion; Flunisolide; Lisinopril; Mometasone; Terazosin; and Valsartan  MEDICATIONS:  Current Facility-Administered Medications  Medication Dose Route Frequency Provider Last Rate Last Dose  . 0.9 %  sodium chloride infusion   Intravenous Continuous Gladstone Lighter, MD 75 mL/hr at 01/25/16 [id number]    . acetaminophen (TYLENOL) tablet 650 mg  650 mg Oral Q6H PRN Hillary Bow, MD       Or  . acetaminophen (TYLENOL) suppository 650 mg  650 mg Rectal Q6H PRN Hillary Bow, MD      . acetaminophen (TYLENOL) tablet 650 mg  650 mg Oral Once Creola Corn, MD      . albuterol (PROVENTIL) (2.5 MG/3ML) 0.083% nebulizer solution 2.5 mg  2.5 mg Nebulization Q2H PRN Hillary Bow, MD   2.5 mg at 01/23/16 1804  . allopurinol (ZYLOPRIM) tablet 300 mg  300 mg Oral Daily Creola Corn, MD   300 mg at 01/25/16 0926  . bisacodyl (DULCOLAX) suppository 10 mg  10 mg Rectal Daily PRN Hillary Bow, MD      . chlorhexidine (  PERIDEX) 0.12 % solution 15 mL  15 mL Mouth Rinse BID Gladstone Lighter, MD   15 mL at 01/25/16 0931  . dexamethasone (DECADRON) injection 12 mg  12 mg Intravenous Once Creola Corn, MD      . docusate sodium (COLACE) capsule 100 mg  100 mg Oral BID Hillary Bow, MD   100 mg at 01/25/16 0926  . enoxaparin (LOVENOX) injection 40 mg  40 mg Subcutaneous Q24H Hillary Bow, MD   40 mg at 01/24/16 2149  . feeding supplement (ENSURE ENLIVE) (ENSURE ENLIVE) liquid 237 mL  237 mL Oral TID BM Gladstone Lighter, MD   237 mL at 01/25/16 0925  . fentaNYL (DURAGESIC - dosed mcg/hr) 12.5 mcg  12.5 mcg Transdermal Q72H Gladstone Lighter, MD   12.5 mcg at 01/23/16 1342  . fentaNYL (SUBLIMAZE) injection 25-50 mcg  25-50 mcg Intravenous Q5 min PRN Amy Penwarden, MD      . HYDROcodone-acetaminophen (NORCO/VICODIN) 5-325 MG per tablet 1-2 tablet  1-2 tablet Oral Q4H PRN Hillary Bow, MD   2 tablet at 01/25/16 [id number]  . HYDROmorphone (DILAUDID) injection 1 mg  1 mg Intravenous Q3H PRN Harrie Foreman, MD   1 mg at 01/25/16 1317  . lactulose (CHRONULAC) 10 GM/15ML solution 20 g  20 g Oral BID PRN Gladstone Lighter, MD      . lisinopril (PRINIVIL,ZESTRIL) tablet 10 mg  10 mg Oral Daily Bincy S Varughese, NP   10 mg at 01/25/16 0929  . MEDLINE mouth rinse  15 mL Mouth Rinse BID Lance Coon, MD   15 mL at 01/24/16 2200  . meperidine (DEMEROL) injection 6.25-12.5 mg  6.25-12.5 mg Intravenous Q5 min PRN Amy Penwarden, MD      . metoprolol (LOPRESSOR) injection 10 mg  10 mg Intravenous Q4H PRN Saundra Shelling, MD   5 mg at 01/25/16 0421  . oxyCODONE (Oxy IR/ROXICODONE) immediate release tablet 5 mg  5 mg Oral Once PRN Amy Penwarden, MD       Or  . oxyCODONE (ROXICODONE) 5 MG/5ML solution 5 mg  5 mg Oral Once PRN Amy Penwarden, MD      . pantoprazole (PROTONIX) EC tablet 40 mg  40 mg Oral Daily Hillary Bow, MD   40 mg at 01/25/16 0928  . polyethylene glycol (MIRALAX / GLYCOLAX) packet 17 g  17 g Oral Daily Gladstone Lighter, MD      . promethazine (PHENERGAN) injection 6.25-12.5 mg  6.25-12.5 mg Intravenous Q15 min PRN Amy Penwarden, MD      . senna (SENOKOT) tablet 8.6 mg  1 tablet Oral  BID Hillary Bow, MD   8.6 mg at 01/25/16 0930  . sodium chloride flush (NS) 0.9 % injection 3 mL  3 mL Intravenous Q12H Srikar Sudini, MD   3 mL at 01/25/16 0932    ECOG PERFORMANCE STATUS:  3 - Symptomatic, >50% confined to bed  REVIEW OF SYSTEMS: Patient is a poor historian aside from the abdominal pain and weight loss Patient denies any weight loss, fatigue, weakness, fever, chills or night sweats. Patient denies any loss of vision, blurred vision. Patient denies any ringing  of the ears or hearing loss. No irregular heartbeat. Patient denies heart murmur or history of fainting. Patient denies any chest pain or pain radiating to her upper extremities. Patient denies any shortness of breath, difficulty breathing at night, cough or hemoptysis. Patient denies any swelling in the lower legs. Patient denies any nausea vomiting, vomiting of blood, or coffee ground material  in the vomitus. Patient denies any stomach pain. Patient states has had normal bowel movements no significant constipation or diarrhea. Patient denies any dysuria, hematuria or significant nocturia. Patient denies any problems walking, swelling in the joints or loss of balance. Patient denies any skin changes, loss of hair or loss of weight. Patient denies any excessive worrying or anxiety or significant depression. Patient denies any problems with insomnia. Patient denies excessive thirst, polyuria, polydipsia. Patient denies any swollen glands, patient denies easy bruising or easy bleeding. Patient denies any recent infections, allergies or URI. Patient "s visual fields have not changed significantly in recent time.    PHYSICAL EXAM: BP (!) 141/70   Pulse 100   Temp 97.9 F (36.6 C) (Axillary)   Resp 18   Ht 6\' 4"  (1.93 m)   Wt 225 lb 12 oz (102.4 kg)   SpO2 93%   BMI 27.48 kg/m  Bedbound male in moderate pain discomfort. He has marked distention of his abdomen. No cervical or supraclavicular adenopathy is  identified. Well-developed well-nourished patient in NAD. HEENT reveals PERLA, EOMI, discs not visualized.  Oral cavity is clear. No oral mucosal lesions are identified. Neck is clear without evidence of cervical or supraclavicular adenopathy. Lungs are clear to A&P. Cardiac examination is essentially unremarkable with regular rate and rhythm without murmur rub or thrill. Abdomen is benign with no organomegaly or masses noted. Motor sensory and DTR levels are equal and symmetric in the upper and lower extremities. Cranial nerves II through XII are grossly intact. Proprioception is intact. No peripheral adenopathy or edema is identified. No motor or sensory levels are noted. Crude visual fields are within normal range.  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 Medical oncology believes he should be started on systemic chemotherapy. I have offered palliative radiation therapy to relieve some of the narcotic dependent pain he is having should chemotherapy not achieved a good response up front. I believe this mass will markedly respond to chemotherapy and I'll reserve any further follow-up in this case until I am called upon to reconsult the patient.  I would like to take this opportunity to thank you for allowing me to participate in the care of your patient.Armstead Peaks., MD

## 2016-01-25 NOTE — Progress Notes (Signed)
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 MD ordered Rituxan 100mg /m2 (200mg ).   Called ans spoke with MD, clarified she wants to give the 100mg /m2 dose today due to the risk of tumor lysis, and then give a second dose tomorrow on 11/10. Second dose will potentially be the remainder of the 375mg /m2. Patient is already on allopurinol 300mg  daily and MD is ok with this for tumor lysis prophylaxis. Patient also has ordered acetaminophen 650mg , benadryl 50mg  po, and dexamethazone 12mg  IV for premedications.

## 2016-01-25 NOTE — Progress Notes (Signed)
Report called to Butch Penny, RN on 1C.  Patient will be going to room 123.  Patient is alert to self and place but has confused conversation at times.  o2 sats mid 90's on 5L nasal cannula.

## 2016-01-26 DIAGNOSIS — C8590 Non-Hodgkin lymphoma, unspecified, unspecified site: Secondary | ICD-10-CM

## 2016-01-26 LAB — COMPREHENSIVE METABOLIC PANEL
ALT: 10 U/L — ABNORMAL LOW (ref 17–63)
ANION GAP: 8 (ref 5–15)
AST: 28 U/L (ref 15–41)
Albumin: 1.8 g/dL — ABNORMAL LOW (ref 3.5–5.0)
Alkaline Phosphatase: 63 U/L (ref 38–126)
BUN: 15 mg/dL (ref 6–20)
CHLORIDE: 99 mmol/L — AB (ref 101–111)
CO2: 29 mmol/L (ref 22–32)
Calcium: 10.4 mg/dL — ABNORMAL HIGH (ref 8.9–10.3)
Creatinine, Ser: 0.86 mg/dL (ref 0.61–1.24)
GFR calc non Af Amer: >60 mL/min (ref >60)
Glucose, Bld: 96 mg/dL (ref 65–99)
POTASSIUM: 3.8 mmol/L (ref 3.5–5.1)
SODIUM: 136 mmol/L (ref 135–145)
Total Bilirubin: 0.3 mg/dL (ref 0.3–1.2)
Total Protein: 5.7 g/dL — ABNORMAL LOW (ref 6.5–8.1)

## 2016-01-26 LAB — CBC WITH DIFFERENTIAL/PLATELET
BASOS PCT: 0 %
Basophils Absolute: 0 10*3/uL (ref 0–0.1)
EOS ABS: 0 10*3/uL (ref 0–0.7)
EOS PCT: 0 %
HCT: 32.1 % — ABNORMAL LOW (ref 40.0–52.0)
HEMOGLOBIN: 10.4 g/dL — AB (ref 13.0–18.0)
LYMPHS PCT: 4 %
Lymphs Abs: 0.9 10*3/uL — ABNORMAL LOW (ref 1.0–3.6)
MCH: 27.6 pg (ref 26.0–34.0)
MCHC: 32.5 g/dL (ref 32.0–36.0)
MCV: 84.8 fL (ref 80.0–100.0)
MONOS PCT: 14 %
Monocytes Absolute: 3.1 10*3/uL — ABNORMAL HIGH (ref 0.2–1.0)
NEUTROS PCT: 82 %
Neutro Abs: 17.8 10*3/uL — ABNORMAL HIGH (ref 1.4–6.5)
Platelets: 342 10*3/uL (ref 150–440)
RBC: 3.78 MIL/uL — ABNORMAL LOW (ref 4.40–5.90)
RDW: 15.5 % — ABNORMAL HIGH (ref 11.5–14.5)
WBC: 21.8 10*3/uL — ABNORMAL HIGH (ref 3.8–10.6)

## 2016-01-26 LAB — TYPE AND SCREEN
ABO/RH(D): O POS
Antibody Screen: NEGATIVE
Unit division: 0
Unit division: 0

## 2016-01-26 LAB — HEPATITIS C ANTIBODY

## 2016-01-26 LAB — HEPATITIS B CORE ANTIBODY, IGM: Hep B C IgM: NEGATIVE

## 2016-01-26 LAB — HEPATITIS B SURFACE ANTIGEN: HEP B S AG: NEGATIVE

## 2016-01-26 LAB — PREPARE RBC (CROSSMATCH)

## 2016-01-26 LAB — URIC ACID: URIC ACID, SERUM: 5.4 mg/dL (ref 4.4–7.6)

## 2016-01-26 LAB — LACTATE DEHYDROGENASE: LDH: 572 U/L — ABNORMAL HIGH (ref 98–192)

## 2016-01-26 MED ORDER — DIPHENHYDRAMINE HCL 50 MG/ML IJ SOLN: 50.0000 mg | Freq: Once | INTRAMUSCULAR | Status: DC

## 2016-01-26 MED ORDER — FAMOTIDINE 20 MG PO TABS: 20.0000 mg | ORAL_TABLET | Freq: Once | ORAL | Status: DC

## 2016-01-26 MED ORDER — ALUM & MAG HYDROXIDE-SIMETH 200-200-20 MG/5ML PO SUSP
30.0000 mL | Freq: Four times a day (QID) | ORAL | Status: DC | PRN
  Administered 2016-01-26: 18:00:00 30 mL via ORAL
  Filled 2016-01-26: qty 30

## 2016-01-26 MED ORDER — DIPHENHYDRAMINE HCL 50 MG/ML IJ SOLN
INTRAMUSCULAR | Status: AC
  Administered 2016-01-26: 20:00:00 50 mg
  Filled 2016-01-26: qty 1

## 2016-01-26 MED ORDER — EPINEPHRINE 0.3 MG/0.3ML IJ SOAJ
0.3000 mg | Freq: Once | INTRAMUSCULAR | Status: DC
  Filled 2016-01-26: qty 0.3

## 2016-01-26 MED ORDER — METHYLPREDNISOLONE SODIUM SUCC 125 MG IJ SOLR: 125.0000 mg | INTRAMUSCULAR | Status: DC

## 2016-01-26 MED ORDER — DEXAMETHASONE SODIUM PHOSPHATE 4 MG/ML IJ SOLN: 4.0000 mg | Freq: Once | INTRAMUSCULAR | Status: DC

## 2016-01-26 MED ORDER — ALLOPURINOL 100 MG PO TABS
300.0000 mg | ORAL_TABLET | Freq: Two times a day (BID) | ORAL | Status: DC
  Administered 2016-01-26 – 2016-01-30 (×8): 300 mg via ORAL
  Filled 2016-01-26 (×8): qty 3

## 2016-01-26 MED ORDER — DEXAMETHASONE SODIUM PHOSPHATE 4 MG/ML IJ SOLN
4.0000 mg | INTRAMUSCULAR | Status: AC
  Administered 2016-01-26: 4 mg via INTRAVENOUS
  Filled 2016-01-26: qty 1

## 2016-01-26 MED ORDER — FAMOTIDINE 20 MG PO TABS
20.0000 mg | ORAL_TABLET | ORAL | Status: AC
  Administered 2016-01-26: 17:00:00 20 mg via ORAL
  Filled 2016-01-26: qty 1

## 2016-01-26 MED ORDER — SODIUM CHLORIDE 0.9 % IV SOLN
100.0000 mg/m2 | Freq: Once | INTRAVENOUS | Status: DC
  Filled 2016-01-26: qty 20

## 2016-01-26 MED ORDER — SODIUM CHLORIDE 0.9 % IV SOLN
100.0000 mg/m2 | Freq: Every day | INTRAVENOUS | Status: AC
  Administered 2016-01-26: 200 mg via INTRAVENOUS
  Filled 2016-01-26: qty 20

## 2016-01-26 MED ORDER — METHYLPREDNISOLONE SODIUM SUCC 125 MG IJ SOLR
INTRAMUSCULAR | Status: AC
  Administered 2016-01-26: 125 mg
  Filled 2016-01-26: qty 2

## 2016-01-26 NOTE — NC FL2 (Signed)
[place] LEVEL OF CARE SCREENING TOOL     IDENTIFICATION  Patient Name: Brian Hughes Birthdate: 1940-06-18 Sex: male Admission Date (Current Location): 01/18/2016  [facility] and [place] Number:  Selena Lesser  ([id number]) Facility and Address:  [facility], [address] [place], [place] [address]      Provider Number: [id number]  Attending Physician Name and Address:  Hillary Bow, MD  Relative Name and Phone Number:       Current Level of Care: Hospital Recommended Level of Care: [place] Prior Approval Number:    Date Approved/Denied:   PASRR Number:  [id number])  Discharge Plan: SNF    Current Diagnoses: Patient Active Problem List   Diagnosis Date Noted  . Protein-calorie malnutrition, severe 01/19/2016  . Hypercalcemia 01/18/2016    Orientation RESPIRATION BLADDER Height & Weight     Self, Time, Place  O2 (6 Liters Oxygen ) Continent Weight: 228 lb 4.8 oz (103.6 kg) Height:  6\' 4"  (193 cm)  BEHAVIORAL SYMPTOMS/MOOD NEUROLOGICAL BOWEL NUTRITION STATUS   (none)  (none) Continent Diet (Regular Diet )  AMBULATORY STATUS COMMUNICATION OF NEEDS Skin   Extensive Assist Verbally Normal                       Personal Care Assistance Level of Assistance  Bathing, Feeding, Dressing Bathing Assistance: Limited assistance Feeding assistance: Independent Dressing Assistance: Limited assistance     Functional Limitations Info  Sight, Hearing, Speech Sight Info: Adequate Hearing Info: Adequate Speech Info: Adequate    SPECIAL CARE FACTORS FREQUENCY  PT (By licensed PT), OT (By licensed OT)     PT Frequency:  (5) OT Frequency:  (5)            Contractures      Additional Factors Info  Code Status, Allergies Code Status Info:  (DNR ) Allergies Info:  (Bupropion, Flunisolide, Lisinopril, Mometasone, Terazosin, Valsartan)           Current Medications (01/26/2016):  This is the  current hospital active medication list Current Facility-Administered Medications  Medication Dose Route Frequency Provider Last Rate Last Dose  . 0.9 %  sodium chloride infusion   Intravenous Continuous Gladstone Lighter, MD 75 mL/hr at 01/26/16 1204    . acetaminophen (TYLENOL) tablet 650 mg  650 mg Oral Q6H PRN Hillary Bow, MD       Or  . acetaminophen (TYLENOL) suppository 650 mg  650 mg Rectal Q6H PRN Hillary Bow, MD      . acetaminophen (TYLENOL) tablet 650 mg  650 mg Oral Once Creola Corn, MD      . albuterol (PROVENTIL) (2.5 MG/3ML) 0.083% nebulizer solution 2.5 mg  2.5 mg Nebulization Q2H PRN Hillary Bow, MD   2.5 mg at 01/25/16 2157  . allopurinol (ZYLOPRIM) tablet 300 mg  300 mg Oral Daily Creola Corn, MD   300 mg at 01/26/16 0935  . amLODipine (NORVASC) tablet 5 mg  5 mg Oral Daily Gladstone Lighter, MD   5 mg at 01/26/16 0935  . bisacodyl (DULCOLAX) suppository 10 mg  10 mg Rectal Daily PRN Srikar Sudini, MD      . chlorhexidine (PERIDEX) 0.12 % solution 15 mL  15 mL Mouth Rinse BID Gladstone Lighter, MD   15 mL at 01/26/16 0935  . dexamethasone (DECADRON) injection 12 mg  12 mg Intravenous Once Creola Corn, MD      . diphenhydrAMINE (BENADRYL) capsule 50 mg  50 mg Oral Once [place]  Perumandla, MD      . docusate sodium (COLACE) capsule 100 mg  100 mg Oral BID Hillary Bow, MD   100 mg at 01/26/16 0935  . enoxaparin (LOVENOX) injection 40 mg  40 mg Subcutaneous Q24H Hillary Bow, MD   40 mg at 01/25/16 1937  . feeding supplement (ENSURE ENLIVE) (ENSURE ENLIVE) liquid 237 mL  237 mL Oral TID BM Gladstone Lighter, MD   237 mL at 01/26/16 0936  . fentaNYL (DURAGESIC - dosed mcg/hr) 12.5 mcg  12.5 mcg Transdermal Q72H Gladstone Lighter, MD   12.5 mcg at 01/23/16 1342  . fentaNYL (SUBLIMAZE) injection 25-50 mcg  25-50 mcg Intravenous Q5 min PRN Amy Penwarden, MD      . HYDROcodone-acetaminophen (NORCO/VICODIN) 5-325 MG per tablet 1-2 tablet  1-2 tablet Oral  Q4H PRN Hillary Bow, MD   2 tablet at 01/26/16 0935  . HYDROmorphone (DILAUDID) injection 1 mg  1 mg Intravenous Q3H PRN Harrie Foreman, MD   1 mg at 01/26/16 1204  . lactulose (CHRONULAC) 10 GM/15ML solution 20 g  20 g Oral BID PRN Gladstone Lighter, MD      . lisinopril (PRINIVIL,ZESTRIL) tablet 10 mg  10 mg Oral Daily Bincy S Varughese, NP   10 mg at 01/26/16 0935  . MEDLINE mouth rinse  15 mL Mouth Rinse BID Lance Coon, MD   15 mL at 01/26/16 0936  . meperidine (DEMEROL) injection 6.25-12.5 mg  6.25-12.5 mg Intravenous Q5 min PRN Amy Penwarden, MD      . metoprolol (LOPRESSOR) injection 10 mg  10 mg Intravenous Q4H PRN Saundra Shelling, MD   5 mg at 01/25/16 0421  . oxyCODONE (Oxy IR/ROXICODONE) immediate release tablet 5 mg  5 mg Oral Once PRN Amy Penwarden, MD       Or  . oxyCODONE (ROXICODONE) 5 MG/5ML solution 5 mg  5 mg Oral Once PRN Amy Penwarden, MD      . pantoprazole (PROTONIX) EC tablet 40 mg  40 mg Oral Daily Hillary Bow, MD   40 mg at 01/26/16 0935  . polyethylene glycol (MIRALAX / GLYCOLAX) packet 17 g  17 g Oral Daily Gladstone Lighter, MD   17 g at 01/26/16 0934  . promethazine (PHENERGAN) injection 6.25-12.5 mg  6.25-12.5 mg Intravenous Q15 min PRN Amy Penwarden, MD      . senna (SENOKOT) tablet 8.6 mg  1 tablet Oral BID Hillary Bow, MD   8.6 mg at 01/26/16 0935  . sodium chloride flush (NS) 0.9 % injection 3 mL  3 mL Intravenous Q12H Hillary Bow, MD   3 mL at 01/26/16 0935     Discharge Medications: Please see discharge summary for a list of discharge medications.  Relevant Imaging Results:  Relevant Lab Results:   Additional Information  (SSN: [id number])  Yohann Curl, [place] Beets, LCSW

## 2016-01-26 NOTE — Clinical Social Work Placement (Signed)
   CLINICAL SOCIAL WORK PLACEMENT  NOTE  Date:  01/26/2016  Patient Details  Name: Brian Hughes MRN: [id number] Date of Birth: 21-Aug-1940  Clinical Social Work is seeking post-discharge placement for this patient at the [place] level of care (*CSW will initial, date and re-position this form in  chart as items are completed):  Yes   Patient/family provided with [facility] Work Department's list of facilities offering this level of care within the geographic area requested by the patient (or if unable, by the patient's family).  Yes   Patient/family informed of their freedom to choose among providers that offer the needed level of care, that participate in Medicare, Medicaid or managed care program needed by the patient, have an available bed and are willing to accept the patient.  Yes   Patient/family informed of 's ownership interest in [facility] and [facility], as well as of the fact that they are under no obligation to receive care at these facilities.  PASRR submitted to EDS on 01/26/16     PASRR number received on 01/26/16     Existing PASRR number confirmed on       FL2 transmitted to all facilities in geographic area requested by pt/family on 01/26/16     FL2 transmitted to all facilities within larger geographic area on       Patient informed that his/her managed care company has contracts with or will negotiate with certain facilities, including the following:            Patient/family informed of bed offers received.  Patient chooses bed at       Physician recommends and patient chooses bed at      Patient to be transferred to   on  .  Patient to be transferred to facility by       Patient family notified on   of transfer.  Name of family member notified:        PHYSICIAN       Additional Comment:    _______________________________________________ Kathey Simer, [place] Beets, LCSW 01/26/2016, 2:08 PM

## 2016-01-26 NOTE — Progress Notes (Addendum)
[facility] [place] NAME: Brian Hughes    MR#:  [id number]  DATE OF BIRTH:  Oct 27, 1940  SUBJECTIVE:  CHIEF COMPLAINT:   Chief Complaint  Patient presents with  . Weakness   - s/p AAA repair 01/24/2016. Doing well. Has abdominal pain and constipation   REVIEW OF SYSTEMS:  Review of Systems  Constitutional: Positive for malaise/fatigue and weight loss. Negative for chills and fever.  HENT: Negative for ear discharge, nosebleeds and tinnitus.   Eyes: Negative for blurred vision.  Respiratory: Negative for cough, shortness of breath and wheezing.   Cardiovascular: Positive for leg swelling. Negative for chest pain and palpitations.  Gastrointestinal: Positive for abdominal pain and constipation. Negative for diarrhea, nausea and vomiting.  Genitourinary: Negative for dysuria and urgency.  Musculoskeletal: Positive for joint pain. Negative for myalgias.  Skin: Negative for rash.  Neurological: Negative for dizziness, sensory change, speech change, focal weakness, seizures and headaches.  Psychiatric/Behavioral: Negative for depression.       Some confusion still present.    DRUG ALLERGIES:   Allergies  Allergen Reactions  . Bupropion Nausea Only and Nausea And Vomiting  . Flunisolide     Other reaction(s): Other (See Comments) Wheezing  . Lisinopril Cough  . Mometasone     Other reaction(s): Other (See Comments) Wheezing  . Terazosin     Other reaction(s): Other (See Comments) Fatigue  . Valsartan Cough    VITALS:  Blood pressure 136/66, pulse 100, temperature 97.7 F (36.5 C), temperature source Oral, resp. rate 18, height 6\' 4"  (1.93 m), weight 103.6 kg (228 lb 4.8 oz), SpO2 93 %.  PHYSICAL EXAMINATION:  Physical Exam  GENERAL:  75 y.o.-year-old patient lying in the bed with no acute distress. More alert EYES: Pupils equal, round, reactive to light and accommodation. No scleral icterus. Extraocular muscles  intact.  HEENT: Head atraumatic, normocephalic. Oropharynx and nasopharynx clear.  NECK:  Supple, no jugular venous distention. No thyroid enlargement, no tenderness.  LUNGS: Normal breath sounds bilaterally, no wheezing, rales,rhonchi or crepitation. No use of accessory muscles of respiration. Diminished at the bases CARDIOVASCULAR: S1, S2 normal. No murmurs, rubs, or gallops.  ABDOMEN: Soft, tenderness in LUQ, abdomen appears distended. Hypoactive Bowel sounds present. No organomegaly or mass.  EXTREMITIES: No pedal edema, cyanosis, or clubbing.  NEUROLOGIC: Cranial nerves II through XII are intact. Muscle strength 5/5 in all extremities. Sensation intact. Gait not checked. Global weakness noted. PSYCHIATRIC: The patient is alert and oriented x 2-3. Intermittent confusion present. SKIN: No obvious rash, lesion, or ulcer.    LABORATORY PANEL:   CBC  Recent Labs Lab 01/26/16 0354  WBC 21.8*  HGB 10.4*  HCT 32.1*  PLT 342   ------------------------------------------------------------------------------------------------------------------  Chemistries   Recent Labs Lab 01/24/16 0412  01/26/16 0354  NA 134*  < > 136  K 4.2  < > 3.8  CL 96*  < > 99*  CO2 29  < > 29  GLUCOSE 96  < > 96  BUN 15  < > 15  CREATININE 0.70  < > 0.86  CALCIUM 10.5*  < > 10.4*  MG 1.7  --   --   AST  --   --  28  ALT  --   --  10*  ALKPHOS  --   --  63  BILITOT  --   --  0.3  < > = values in this interval not displayed. ------------------------------------------------------------------------------------------------------------------  Cardiac Enzymes No  results for input(s): TROPONINI in the last 168 hours. ------------------------------------------------------------------------------------------------------------------  RADIOLOGY:  No results found.  EKG:   Orders placed or performed during the hospital encounter of 01/18/16  . ED EKG  . ED EKG  . EKG 12-Lead  . EKG 12-Lead     ASSESSMENT AND PLAN:   75 year old male with past medical history significant for asthma, hypertension presents to the hospital secondary to weight loss, abdominal pain and constipation going on for almost 4 months now.  #1 Hypercalcemia-hypercalcemia is secondary to underlying malignancy. -Receiving IV fluids. Also on calcitonin- discontinue calcitonin now -Zometa given 01/19/2016. Received 1 dose of decadron over the weekend. -monitor. Improving calcium and stable at 10.5.  #2 Abdominal mass-20 cm x 20 cm abdominal mass of unknown primary. Possible underlying malignancy - LDH is elevated, as is uric acid. - Oncology has been consulted. CT guided biopsy done. Path results pending, likely lymphoma -- CT chest ordered as part of work up- no suspicious masses seen - Being started on IV Rituxan today.  Patient refused treatment yesterday due to poor understanding. I have explained findings per oncology.   #3 Abdominal distention and abdominal pain-  secondary to abdominal mass, also ileus noted on KUB as patient had been constipated - was NPO, start clears- has flatulence. Advance regular diet - encourage ambulation, no indication for NG tube as no nausea/vomiting or obstruction - on fentanyl patch low dose and continue the oral prn pain meds- watch carefully with pain meds - laxatives ordered   #4 Confusion- metabolic encephalopathy- likely from underlying infection and hypercalcemia- Resolved Improving, no focal deficits - CT head on admission normal   #5 AAA- infrarenal AAA of about 6.5 cm noted with no rupture but has has mural plaque Vascular surgery consulted- cardiac clearance appreciated- please see their note. -, ECHO ordered. Showing LVH, EF is [id number], mild diastolic dysfunction noted. -  S/P REPAIR ENDOVASCULAR DONE ON 01/24/16  #6 DVT prophylaxis-on Lovenox-  #7 Leukocytosis-differential with 90% neutrophils. Chest x-ray showing lingular infiltrate. CAP. Was on Rocephin  and azithromycin. Treated for 5 days total. -UA without any underlying infection Now elevated white count due to lymphoma and Decadron  #8 HTN- started lisinopril  SNF at discharge. Likely Monday  All the records are reviewed and case discussed with Care Management/Social Workerr. Management plans discussed with the patient, family and they are in agreement.  CODE STATUS: DNR  TOTAL TIME TAKING CARE OF THIS PATIENT: 35 minutes. Hillary Bow R M.D on 01/26/2016 at 10:36 AM  Between 7am to 6pm - Pager - [phone]  After 6pm go to [email or web address] - password [email or web address] [facility]  Office  [phone]  CC: Primary care physician; No PCP Per Patient

## 2016-01-26 NOTE — Progress Notes (Signed)
[facility] [place] NAME: Lionel Suda    MRN#:  [id number]  DATE OF BIRTH:  Jan 04, 1941  SUBJECTIVE:  Hospital Day: 8 days Jeffrie Dicola is a 75 y.o. male presenting with Weakness .  Called by nursing staff for concern of infusion reaction   REVIEW OF SYSTEMS:  Unable to obtain given medical acuity  DRUG ALLERGIES:   Allergies  Allergen Reactions  . Bupropion Nausea Only and Nausea And Vomiting  . Flunisolide     Other reaction(s): Other (See Comments) Wheezing  . Lisinopril Cough  . Mometasone     Other reaction(s): Other (See Comments) Wheezing  . Terazosin     Other reaction(s): Other (See Comments) Fatigue  . Valsartan Cough    VITALS:  Blood pressure (!) 145/86, pulse (!) 157, temperature 99 F (37.2 C), temperature source Oral, resp. rate (!) 24, height 6\' 4"  (1.93 m), weight 103.6 kg (228 lb 4.8 oz), SpO2 100 %.  PHYSICAL EXAMINATION:  VITAL SIGNS: Vitals:   01/26/16 2010 01/26/16 2020  BP: (!) 158/85 (!) 145/86  Pulse: (!) 120 (!) 157  Resp: (!) 28 (!) 24  Temp:  99 F (37.2 C)   GENERAL:74 y.o.male currently in acute distress.  HEAD: Normocephalic, atraumatic.  EYES: Pupils equal, round, reactive to light. Extraocular muscles intact. No scleral icterus.  MOUTH: Moist mucosal membrane. Dentition intact. No abscess noted.  EAR, NOSE, THROAT: Clear without exudates. No external lesions.  NECK: Supple. No thyromegaly. No nodules. No JVD.  PULMONARY: Clear to ascultation, without wheeze rails or rhonci. No use of accessory muscles, Good respiratory effort. good air entry bilaterally CHEST: Nontender to palpation.  CARDIOVASCULAR: S1 and S2. tachy. No murmurs, rubs, or gallops. No edema. Pedal pulses 2+ bilaterally.  GASTROINTESTINAL: Soft, nontender, nondistended. No masses. Positive bowel sounds. No hepatosplenomegaly.  MUSCULOSKELETAL: No swelling, clubbing, or edema. Range of motion full in  all extremities.  NEUROLOGIC: Cranial nerves II through XII are intact. No gross focal neurological deficits. Sensation intact. Reflexes intact.  SKIN: flushed, No ulceration, lesions, rashes, or cyanosis. Skin warm and dry. Turgor intact.  PSYCHIATRIC: Mood, affect within normal limits. The patient is awake, alert and oriented x 3. Insight, judgment intact.      LABORATORY PANEL:   CBC  Recent Labs Lab 01/26/16 0354  WBC 21.8*  HGB 10.4*  HCT 32.1*  PLT 342   ------------------------------------------------------------------------------------------------------------------  Chemistries   Recent Labs Lab 01/24/16 0412  01/26/16 0354  NA 134*  < > 136  K 4.2  < > 3.8  CL 96*  < > 99*  CO2 29  < > 29  GLUCOSE 96  < > 96  BUN 15  < > 15  CREATININE 0.70  < > 0.86  CALCIUM 10.5*  < > 10.4*  MG 1.7  --   --   AST  --   --  28  ALT  --   --  10*  ALKPHOS  --   --  63  BILITOT  --   --  0.3  < > = values in this interval not displayed. ------------------------------------------------------------------------------------------------------------------  Cardiac Enzymes No results for input(s): TROPONINI in the last 168 hours. ------------------------------------------------------------------------------------------------------------------  RADIOLOGY:  No results found.  EKG:   Orders placed or performed during the hospital encounter of 01/18/16  . ED EKG  . ED EKG  . EKG 12-Lead  . EKG 12-Lead    ASSESSMENT AND PLAN:   Herbie Baltimore  [place] is a 75 y.o. male presenting with Weakness . Admitted 01/18/2016 : Day #: 8 days  1. rituxan infusion reaction: solumedrol, benadryl, give demerol for symptoms Symptoms improved,   Time spent 45 mins   Hower,  Karenann Cai.D on 01/26/2016 at 8:32 PM  Between 7am to 6pm - Pager - [phone]  After 6pm: House Pager: - [phone]  [facility]  Office  [phone]  CC: Primary care physician; No PCP Per  Patient

## 2016-01-26 NOTE — Progress Notes (Signed)
Pt confused at intervals, unable to understand all of the information r/t chemo at this time, unable to reach family at this time.  Dr. Junius Finner notified, no chemo to be given at this time, will monitor for family to come in and notify Dr. Junius Finner for consent at that time.

## 2016-01-26 NOTE — Evaluation (Signed)
Physical Therapy Re-Evaluation Patient Details Name: Brian Hughes MRN: [id number] DOB: 1941/01/16 Today's Date: 01/26/2016   History of Present Illness  Pt admitted for complaints of weakness and abdominal pain. Per chart review, pt recently lost 40 lbs in last 3 months.  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  New PT orders received for re-eval post-op.  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 Pt is very poor historian secondary to confusion.  Pt with history of ashtma, HTN, and cancer.  Clinical Impression  Pt seen for PT re-evaluation post-op.  Prior to hospital admission, pt reports using 4ww and has son (who is legally blind) that helps him as needed.  Pt lives in 1 level home with stairs to enter.  Pt appearing confused intermittently during session so unsure of accuracy of noted PLOF/home-set up details.  Currently pt is min assist supine to sit, mod to max assist sit to/from stand with RW, and min assist x2 to take steps with RW bed to recliner.  First attempt at marching in place (with RW use) pt's B knees buckled requiring max assist of therapist to safely sit pt on edge of bed.  Activity limited d/t pt fatigue/decreased activity tolerance.  Pt would benefit from skilled PT to address noted impairments and functional limitations.  Recommend pt discharge to STR when medically appropriate.    Follow Up Recommendations SNF    Equipment Recommendations  Rolling walker with 5" wheels    Recommendations for Other Services       Precautions / Restrictions Precautions Precautions: Fall Restrictions Weight Bearing Restrictions: No      Mobility  Bed Mobility Overal bed mobility: Needs Assistance Bed Mobility: Supine to Sit     Supine to sit: Min assist     General bed mobility comments: via logrolling; vc's for technique required; heavy use of side rail; increased time to perform  Transfers Overall transfer level: Needs  assistance Equipment used: Rolling walker (2 wheeled) Transfers: Sit to/from Stand Sit to Stand: Mod assist;Max assist         General transfer comment: x3 trials from bed; pt persistent on using B UE's on RW to stand; assist to steady pt upon standing; vc's for safety required  Ambulation/Gait Ambulation/Gait assistance: Min assist;+2 physical assistance Ambulation Distance (Feet): 3 Feet Assistive device: Rolling walker (2 wheeled)   Gait velocity: decreased   General Gait Details: decreased B step length/foot clearance/heelstrike; 1st attempt at marching in place pt's B knees buckled requiring max assist to sit pt back on edge of bed safely; 2nd attempt at marching in place pt able to march in place but B knees noted to be increasing in flexion so pt assist back onto edge of bed sitting; 3rd attempt had 2nd person assist (min assist x2) and pt able to take steps with RW bed to chair (limited distance d/t fatigue; mild B knee flexion noted)  Stairs            Wheelchair Mobility    Modified Rankin (Stroke Patients Only)       Balance Overall balance assessment: Needs assistance;History of Falls Sitting-balance support: Bilateral upper extremity supported;Feet supported Sitting balance-Leahy Scale: Fair Sitting balance - Comments: Pt requiring B UE support on bed rail and rail at bottom of bed to prevent posterior lean Postural control: Posterior lean Standing balance support: Bilateral upper extremity supported Standing balance-Leahy Scale: Fair Standing balance comment: static standing with RW  Pertinent Vitals/Pain Pain Assessment: 0-10 Pain Score: 7  Pain Location: L abdomen Pain Descriptors / Indicators: Discomfort Pain Intervention(s): Limited activity within patient's tolerance;Monitored during session;Premedicated before session;Repositioned  HR and O2 WFL on 6 L/min O2 via nasal cannula during session.    Home  Living Family/patient expects to be discharged to:: Private residence Living Arrangements: Children Available Help at Discharge: Family Type of Home: House Home Access: Stairs to enter   [employer] of Steps: 2 small steps no railing Home Layout: One level Home Equipment: [place] - 4 wheels Additional Comments: Pt reports his son is legally blind (can see shadows) but provides assist to pt as needed.    Prior Function Level of Independence: Independent with assistive device(s)         Comments: Pt reports using 4ww prior to admission.     Hand Dominance        Extremity/Trunk Assessment   Upper Extremity Assessment: Generalized weakness           Lower Extremity Assessment: Generalized weakness         Communication   Communication: No difficulties  Cognition Arousal/Alertness: Awake/alert Behavior During Therapy:  (Verbose; difficulty staying on task) Overall Cognitive Status: No family/caregiver present to determine baseline cognitive functioning (Oriented to person, place.  Pt inconsistent with situation and date.)       Memory: Decreased short-term memory              General Comments General comments (skin integrity, edema, etc.): Pt sitting in bed upon PT arrival.  Nursing cleared pt for participation in physical therapy and gave pt pain meds prior to session.  Pt agreeable to PT session.     Exercises Total Joint Exercises Ankle Circles/Pumps: AROM;Strengthening;Both;10 reps;Supine Quad Sets: AROM;Strengthening;Both;10 reps;Supine Short Arc Quad: AROM;Strengthening;Both;10 reps;Supine Heel Slides: AAROM;Strengthening;Both;10 reps;Supine Hip ABduction/ADduction: AROM;Strengthening;Both;10 reps;Supine  Vc's required for technique.   Assessment/Plan    PT Assessment Patient needs continued PT services  PT Problem List Decreased strength;Decreased activity tolerance;Decreased balance;Decreased mobility;Decreased knowledge of use of  DME;Decreased knowledge of precautions;Pain          PT Treatment Interventions DME instruction;Gait training;Stair training;Functional mobility training;Therapeutic activities;Therapeutic exercise;Balance training;Patient/family education    PT Goals (Current goals can be found in the Care Plan section)  Acute Rehab PT Goals Patient Stated Goal: to get stronger PT Goal Formulation: With patient Time For Goal Achievement: 02/09/16 Potential to Achieve Goals: Good    Frequency Min 2X/week   Barriers to discharge Decreased caregiver support      Co-evaluation               End of Session Equipment Utilized During Treatment: Gait belt;Oxygen (6 L/min O2 via nasal cannula) Activity Tolerance: Patient limited by fatigue Patient left: in chair;with call bell/phone within reach;with chair alarm set Nurse Communication: Mobility status (knees buckling during session and recommending 2 person assist for mobility for safety)         Time: [id number] PT Time Calculation (min) (ACUTE ONLY): 26 min   Charges:   PT Evaluation $PT Re-evaluation: 1 Procedure PT Treatments $Therapeutic Exercise: 8-22 mins   PT G CodesLeitha Bleak 02-12-2016, 11:20 AM Leitha Bleak, [place]

## 2016-01-26 NOTE — Progress Notes (Addendum)
Chart reviewed. Rituxan was not given because patient apparently refused. I was unable to reach his son or significant other listed as emergency contact.  I saw patient today, he is very alert, this Am, and was actually able to have a meaningful conversation. He asked if he has cancer. 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 I also explained to him that , to begin with we will only administer Rituxan and will add chemotherapy a few days later. I have explained to him that he will get the drug called Rituxan today and that it has the potential to cause infusion reactions that could be rash, wheezing and low BP , and that some times, these reactions are serious requiring intubation and resusctiation. The alternatives to systemic treatment is radiation to the mass to alleviate his symptoms, but that it is only going to help him temporarily, he would still require systemic therapy for long term control. Plus, irradiation to the mass will likely give him more intestinal side effects such as diarrhea, nausea or vomiting.  With regard to the prognosis and curability of the lymphoma, I told him that I am awaiting full path reports I have explained to him that the treatment is being initiated on an urgent basis not only to help improve his symptoms but also to prevent resurgence of hypercalcemia that could be fatal. He seemed to have a good understanding of our discussibn today, He agrees to move forward with Rituxan as planned.  I have asked him or his family  to contact me with any further questions.  CBC. CMP, LD URIC acid, calcium have been reviewed, stable  Will follow.  Addendum: 4 ;00 pm I was told, that the patient did nto remember anything that we discussed earlier as above. Nurse feels his orientation and memory fluctuates. Therefore, I spoke to  his son at his bedside who gave [place] the consent to proceed with treatment as planned. I discussed everything outlined above with him as well.  Hep c and B screen is negative Will rule out G-6pd def in case Rasburicase is needed in the future.  I reordered Ritixan 100 mg/m2 to be given daily for 2 days. I have added decadron and pepcid for pre meds. I will hold off on higher doses of prednisone due to history suggestive of steroid intolerance. We will continue to follow closely for tumor lysis. Dr. Mike Gip is on call this weekend, I have discussed the plan with her.

## 2016-01-26 NOTE — Progress Notes (Signed)
Nutrition Follow-up  DOCUMENTATION CODES:   Severe malnutrition in context of acute illness/injury  INTERVENTION:  -Spoke with MD Sudini and agreeable for advancing diet to regular.  Lunch order taken and discussed with kitchen.   -Encouraged continued ensure TID for additional calories and protein.  Discussed importance of small frequent meals including high calorie, high protein food choices.  Pt will need re-education and re-education    NUTRITION DIAGNOSIS:   Malnutrition related to acute illness as evidenced by percent weight loss, energy intake < or equal to 50% for > or equal to 5 days.  Ongoing  GOAL:   Patient will meet greater than or equal to 90% of their needs  Improving  MONITOR:   PO intake, Supplement acceptance, Weight trends  REASON FOR ASSESSMENT:   Malnutrition Screening Tool    ASSESSMENT:   Pt s/p AAA repair on 11/8.  Pt with lymphoma and planning chemotherapy with rituxan.  Noted po intake 38% per I and O sheet. Has been tolerating clear liquids last few days.   Medications reviewed: decadron, colace, miralax, NS at 51ml/hr Labs reviewed:   Diet Order:  Diet regular Room service appropriate? Yes; Fluid consistency: Thin  Skin:  Reviewed, no issues  Last BM:  11/9  Height:   Ht Readings from Last 1 Encounters:  01/24/16 6\' 4"  (1.93 m)    Weight:   Wt Readings from Last 1 Encounters:  01/26/16 228 lb 4.8 oz (103.6 kg)    Ideal Body Weight:     BMI:  Body mass index is 27.79 kg/m.  Estimated Nutritional Needs:   Kcal:  [id number] kcals/d  Protein:  111-140 g/d  Fluid:  >/= 2.3 L/d  EDUCATION NEEDS:   No education needs identified at this time  Brian Hughes B. Zenia Resides, Brian Hughes, Brian Hughes (pager)

## 2016-01-26 NOTE — Progress Notes (Signed)
1817 Rituxan 200 mg in 270 ml of NS started at 25mg /hr at a rate of 37 ml/hr for 18.38ml/  Patient premeded per orders with decadron and pepcid.  Remained with patient, will monitor.  1847 patient without s/s of reaction, rituxan increased to 52ml/hr for 74ml over 30 minutes will continue to assess.  [id number] patient without s/s of reaction, rituxan increased to 111 ml/hr for 55.5 ml over 30 minutes, will continue to assess  1947 patient without s/s of reaction, rituxan increased to 148 ml/hr for 74 ml over 30 minutes, will continue to assess   2002 Patient c/o being cold, noted with hard chills at present time, Rituxan stopped, NS to flush. VS 98.6-158-28-191/150 sat 68% on o2 at 6 lpm, Dr. Lavetta Nielsen notified, orders received.  2008 Benadryl 50 mg iv given for reaction per orders, respiratory therapy in room changed to NRB at 100% 2010 Solumedrol 125 mg iv given for reaction.  VS HR 120 bp 158/85 sats 96% on nrb at 100 % o2. Dr. Lavetta Nielsen present. Patient continues with rigors and chills, orders for Demerol given by Dr. Lavetta Nielsen.  2019 Demerol 12.5 mg iv given for rigors 2020 VS 99-157-145/86-100% on NRB at 100% O2  2035 141/48-127-100% on NRB Total dose received 175ml out of 277ml for a total dose of 109.52 MG given. 2050 Changed back to o2 at 6LPM sats cannot hold sats 85% changed back to NRB at 100% o2 will continue to assess.  Lanae Crumbly RN in room with above reaction, report given to Lanae Crumbly RN

## 2016-01-26 NOTE — Care Management (Signed)
S/p AAA repair 9/8.  PT follow up today.  Continues to recommend SNF

## 2016-01-26 NOTE — Progress Notes (Signed)
Clinical Social Worker (CSW) met with patient to discuss D/C plan. PT is recommending SNF. Patient is agreeable to SNF search in [facility] and does not have a preference. Per MD patient will likely be ready for D/C Monday or Tuesday and not over the weekend. CSW attempted to contact patient's son however he did not answer a voicemail was left. CSW will continue to follow and assist as needed.   [employer], LCSW [phone]

## 2016-01-27 DIAGNOSIS — C8593 Non-Hodgkin lymphoma, unspecified, intra-abdominal lymph nodes: Secondary | ICD-10-CM

## 2016-01-27 DIAGNOSIS — R1909 Other intra-abdominal and pelvic swelling, mass and lump: Secondary | ICD-10-CM

## 2016-01-27 DIAGNOSIS — Z888 Allergy status to other drugs, medicaments and biological substances status: Secondary | ICD-10-CM

## 2016-01-27 DIAGNOSIS — M549 Dorsalgia, unspecified: Secondary | ICD-10-CM

## 2016-01-27 DIAGNOSIS — R0902 Hypoxemia: Secondary | ICD-10-CM

## 2016-01-27 DIAGNOSIS — Z8679 Personal history of other diseases of the circulatory system: Secondary | ICD-10-CM

## 2016-01-27 DIAGNOSIS — R5382 Chronic fatigue, unspecified: Secondary | ICD-10-CM

## 2016-01-27 DIAGNOSIS — Z66 Do not resuscitate: Secondary | ICD-10-CM

## 2016-01-27 DIAGNOSIS — Z79899 Other long term (current) drug therapy: Secondary | ICD-10-CM

## 2016-01-27 DIAGNOSIS — D72829 Elevated white blood cell count, unspecified: Secondary | ICD-10-CM

## 2016-01-27 LAB — CBC WITH DIFFERENTIAL/PLATELET
BASOS ABS: 0.1 10*3/uL (ref 0–0.1)
Basophils Relative: 1 %
EOS ABS: 0 10*3/uL (ref 0–0.7)
EOS PCT: 0 %
HCT: 32.6 % — ABNORMAL LOW (ref 40.0–52.0)
Hemoglobin: 10.9 g/dL — ABNORMAL LOW (ref 13.0–18.0)
Lymphocytes Relative: 1 %
Lymphs Abs: 0.3 10*3/uL — ABNORMAL LOW (ref 1.0–3.6)
MCH: 27.9 pg (ref 26.0–34.0)
MCHC: 33.3 g/dL (ref 32.0–36.0)
MCV: 83.9 fL (ref 80.0–100.0)
Monocytes Absolute: 0.9 10*3/uL (ref 0.2–1.0)
Monocytes Relative: 4 %
Neutro Abs: 23.3 10*3/uL — ABNORMAL HIGH (ref 1.4–6.5)
Neutrophils Relative %: 94 %
PLATELETS: 374 10*3/uL (ref 150–440)
RBC: 3.89 MIL/uL — AB (ref 4.40–5.90)
RDW: 15.6 % — ABNORMAL HIGH (ref 11.5–14.5)
WBC: 24.7 10*3/uL — AB (ref 3.8–10.6)

## 2016-01-27 LAB — COMPREHENSIVE METABOLIC PANEL WITH GFR
ALT: 11 U/L — ABNORMAL LOW (ref 17–63)
AST: 28 U/L (ref 15–41)
Albumin: 1.9 g/dL — ABNORMAL LOW (ref 3.5–5.0)
Alkaline Phosphatase: 67 U/L (ref 38–126)
Anion gap: 10 (ref 5–15)
BUN: 17 mg/dL (ref 6–20)
CO2: 26 mmol/L (ref 22–32)
Calcium: 10 mg/dL (ref 8.9–10.3)
Chloride: 102 mmol/L (ref 101–111)
Creatinine, Ser: 0.78 mg/dL (ref 0.61–1.24)
GFR calc Af Amer: >60 mL/min
GFR calc non Af Amer: >60 mL/min
Glucose, Bld: 124 mg/dL — ABNORMAL HIGH (ref 65–99)
Potassium: 4.5 mmol/L (ref 3.5–5.1)
Sodium: 138 mmol/L (ref 135–145)
Total Bilirubin: 0.3 mg/dL (ref 0.3–1.2)
Total Protein: 5.8 g/dL — ABNORMAL LOW (ref 6.5–8.1)

## 2016-01-27 LAB — LACTATE DEHYDROGENASE: LDH: 555 U/L — ABNORMAL HIGH (ref 98–192)

## 2016-01-27 LAB — URIC ACID: Uric Acid, Serum: 4.7 mg/dL (ref 4.4–7.6)

## 2016-01-27 NOTE — Progress Notes (Signed)
[facility] Hematology/Oncology Progress Note  Date of admission: 01/18/2016  Hospital day:  01/27/2016  Chief Complaint: Harshan Kearley is a 75 y.o. male who was admitted with a large abdominal mass, hypercalcemia, leukocytosis, and hypoxia.  Subjective:  Feels better today.  Social History:  He lives in [place] with his son.  His wife of 40 years died of cancer .  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  The patient is alone today.  Allergies:  Allergies  Allergen Reactions  . Bupropion Nausea Only and Nausea And Vomiting  . Flunisolide     Other reaction(s): Other (See Comments) Wheezing  . Lisinopril Cough  . Mometasone     Other reaction(s): Other (See Comments) Wheezing  . Terazosin     Other reaction(s): Other (See Comments) Fatigue  . Valsartan Cough    Scheduled Medications: . acetaminophen  650 mg Oral Once  . allopurinol  300 mg Oral BID  . amLODipine  5 mg Oral Daily  . chlorhexidine  15 mL Mouth Rinse BID  . dexamethasone  4 mg Intravenous Once  . diphenhydrAMINE  50 mg Oral Once  . diphenhydrAMINE  50 mg Intravenous Once  . docusate sodium  100 mg Oral BID  . enoxaparin (LOVENOX) injection  40 mg Subcutaneous Q24H  . famotidine  20 mg Oral Once  . feeding supplement (ENSURE ENLIVE)  237 mL Oral TID BM  . fentaNYL  12.5 mcg Transdermal Q72H  . lisinopril  10 mg Oral Daily  . mouth rinse  15 mL Mouth Rinse BID  . pantoprazole  40 mg Oral Daily  . polyethylene glycol  17 g Oral Daily  . senna  1 tablet Oral BID  . sodium chloride flush  3 mL Intravenous Q12H    Review of Systems: GENERAL:  General fatigue.  Feels better.  No fevers or sweats.  Weight loss of 50 pounds over past 4 months. PERFORMANCE STATUS (ECOG):  1 HEENT:  No visual changes, runny nose, sore throat, mouth sores or tenderness. Lungs: No shortness of breath or cough.  No hemoptysis. Cardiac:   No chest pain, palpitations, orthopnea, or PND. GI:  Left sided abdominal pain.  Diarrhea.  Stool dark.  No nausea, vomiting, constipation, melena or hematochezia. GU:  No urgency, frequency, dysuria, or hematuria. Musculoskeletal:  Back pain.  No joint pain.  No muscle tenderness. Extremities:  No pain or swelling. Skin:  No rashes or skin changes. Neuro:  No headache, numbness or weakness, balance or coordination issues. Endocrine:  No diabetes, thyroid issues, hot flashes or night sweats. Psych:  No mood changes, depression or anxiety. Pain:  No focal pain. Review of systems:  All other systems reviewed and found to be negative.  Physical Exam: Blood pressure (!) 142/65, pulse (!) 53, temperature 97.5 F (36.4 C), temperature source Oral, resp. rate 20, height 6' 4" (1.93 m), weight 224 lb (101.6 kg), SpO2 96 %.  GENERAL: Chronically fatigued slightly disheveled appearing gentleman sitting comfortably in the exam room in no acute distress. MENTAL STATUS:  Alert and oriented to person, place and time. HEAD:  [place] with male pattern baldness.  Normocephalic, atraumatic, face symmetric, no Cushingoid features. EYES:  Blue eyes.  Pupils equal round and reactive to light and accomodation.  No conjunctivitis or scleral icterus. ENT:  [place] in place.  Oropharynx clear without lesion.  Upper dentures.  No lower  teeth.  Tongue normal. Mucous membranes moist.  RESPIRATORY:  Decreased breath sounds at the bases.  Otherwise, clear to auscultation without rales, wheezes or rhonchi. CARDIOVASCULAR:  Regular rate and rhythm without murmur, rub or gallop. ABDOMEN:  Fully round. Soft, non-tender to deep palpation, with active bowel sounds, and no appreciable hepatosplenomegaly.  Left upper quadrant fullness. SKIN:  No rashes, ulcers or lesions.  Will healing groin incisions. EXTREMITIES:  Mild right lower extremity edema.  No skin discoloration or tenderness.  No palpable cords. LYMPH NODES: No palpable  cervical, supraclavicular, axillary or inguinal adenopathy  NEUROLOGICAL: Unremarkable. PSYCH:  Appropriate.  Questionable clear on details.  Results for orders placed or performed during the hospital encounter of 01/18/16 (from the past 48 hour(s))  Lactate dehydrogenase     Status: Abnormal   Collection Time: 01/26/16  3:54 AM  Result Value Ref Range   LDH 572 (H) 98 - 192 U/L  Uric acid     Status: None   Collection Time: 01/26/16  3:54 AM  Result Value Ref Range   Uric Acid, Serum 5.4 4.4 - 7.6 mg/dL  Comprehensive metabolic panel     Status: Abnormal   Collection Time: 01/26/16  3:54 AM  Result Value Ref Range   Sodium 136 135 - 145 mmol/L   Potassium 3.8 3.5 - 5.1 mmol/L   Chloride 99 (L) 101 - 111 mmol/L   CO2 29 22 - 32 mmol/L   Glucose, Bld 96 65 - 99 mg/dL   BUN 15 6 - 20 mg/dL   Creatinine, Ser 0.86 0.61 - 1.24 mg/dL   Calcium 10.4 (H) 8.9 - 10.3 mg/dL   Total Protein 5.7 (L) 6.5 - 8.1 g/dL   Albumin 1.8 (L) 3.5 - 5.0 g/dL   AST 28 15 - 41 U/L   ALT 10 (L) 17 - 63 U/L   Alkaline Phosphatase 63 38 - 126 U/L   Total Bilirubin 0.3 0.3 - 1.2 mg/dL   GFR calc non Af Amer >60 >60 mL/min   GFR calc Af Amer >60 >60 mL/min    Comment: (NOTE) The eGFR has been calculated using the CKD EPI equation. This calculation has not been validated in all clinical situations. eGFR's persistently <60 mL/min signify possible Chronic Kidney Disease.    Anion gap 8 5 - 15  CBC with Differential     Status: Abnormal   Collection Time: 01/26/16  3:54 AM  Result Value Ref Range   WBC 21.8 (H) 3.8 - 10.6 K/uL   RBC 3.78 (L) 4.40 - 5.90 MIL/uL   Hemoglobin 10.4 (L) 13.0 - 18.0 g/dL   HCT 32.1 (L) 40.0 - 52.0 %   MCV 84.8 80.0 - 100.0 fL   MCH 27.6 26.0 - 34.0 pg   MCHC 32.5 32.0 - 36.0 g/dL   RDW 15.5 (H) 11.5 - 14.5 %   Platelets 342 150 - 440 K/uL   Neutrophils Relative % 82 %   Lymphocytes Relative 4 %   Monocytes Relative 14 %   Eosinophils Relative 0 %   Basophils Relative 0  %   Neutro Abs 17.8 (H) 1.4 - 6.5 K/uL   Lymphs Abs 0.9 (L) 1.0 - 3.6 K/uL   Monocytes Absolute 3.1 (H) 0.2 - 1.0 K/uL   Eosinophils Absolute 0.0 0 - 0.7 K/uL   Basophils Absolute 0.0 0 - 0.1 K/uL   RBC Morphology MIXED RBC POPULATION     Comment: HYPOCHROMIA  Lactate dehydrogenase     Status: Abnormal  Collection Time: 01/27/16  5:12 AM  Result Value Ref Range   LDH 555 (H) 98 - 192 U/L  Uric acid     Status: None   Collection Time: 01/27/16  5:12 AM  Result Value Ref Range   Uric Acid, Serum 4.7 4.4 - 7.6 mg/dL  Comprehensive metabolic panel     Status: Abnormal   Collection Time: 01/27/16  5:12 AM  Result Value Ref Range   Sodium 138 135 - 145 mmol/L   Potassium 4.5 3.5 - 5.1 mmol/L   Chloride 102 101 - 111 mmol/L   CO2 26 22 - 32 mmol/L   Glucose, Bld 124 (H) 65 - 99 mg/dL   BUN 17 6 - 20 mg/dL   Creatinine, Ser 0.78 0.61 - 1.24 mg/dL   Calcium 10.0 8.9 - 10.3 mg/dL   Total Protein 5.8 (L) 6.5 - 8.1 g/dL   Albumin 1.9 (L) 3.5 - 5.0 g/dL   AST 28 15 - 41 U/L   ALT 11 (L) 17 - 63 U/L   Alkaline Phosphatase 67 38 - 126 U/L   Total Bilirubin 0.3 0.3 - 1.2 mg/dL   GFR calc non Af Amer >60 >60 mL/min   GFR calc Af Amer >60 >60 mL/min    Comment: (NOTE) The eGFR has been calculated using the CKD EPI equation. This calculation has not been validated in all clinical situations. eGFR's persistently <60 mL/min signify possible Chronic Kidney Disease.    Anion gap 10 5 - 15  CBC with Differential     Status: Abnormal   Collection Time: 01/27/16  5:12 AM  Result Value Ref Range   WBC 24.7 (H) 3.8 - 10.6 K/uL   RBC 3.89 (L) 4.40 - 5.90 MIL/uL   Hemoglobin 10.9 (L) 13.0 - 18.0 g/dL   HCT 32.6 (L) 40.0 - 52.0 %   MCV 83.9 80.0 - 100.0 fL   MCH 27.9 26.0 - 34.0 pg   MCHC 33.3 32.0 - 36.0 g/dL   RDW 15.6 (H) 11.5 - 14.5 %   Platelets 374 150 - 440 K/uL   Neutrophils Relative % 94 %   Neutro Abs 23.3 (H) 1.4 - 6.5 K/uL   Lymphocytes Relative 1 %   Lymphs Abs 0.3 (L) 1.0 -  3.6 K/uL   Monocytes Relative 4 %   Monocytes Absolute 0.9 0.2 - 1.0 K/uL   Eosinophils Relative 0 %   Eosinophils Absolute 0.0 0 - 0.7 K/uL   Basophils Relative 1 %   Basophils Absolute 0.1 0 - 0.1 K/uL   No results found.  Assessment:  Omari Mcmanaway is a 75 y.o. 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  LDH was 572 on 01/26/2016.  Abdomen and pelvic CT scan on 01/18/2016 revealed a 20 cm heterogeneously enhancing and partially cystic or necrotic soft tissue mass inseparable from the proximal stomach, spleen, body and tail of the pancreas, and left adrenal gland. Extensive upper abdominal and retrocrural lymphadenopathy. No evidence of liver metastatic disease or biliary obstruction.   There was a 6.4 cm infrarenal abdominal aortic aneurysm with no eevidence of acute rupture. There was extensive upper abdominal and root of the mesentery metastatic nodal disease but no distant metastatic disease identified in the pelvis, skeleton, or lower lungs.  Chest CT on 01/21/2016 revealed small bilateral pleural effusions and large left upper quadrant mass adenopathy as reported on CT abdomen.  There was a subpleural nodule right  lower lobe 18 x 14 mm with question additional subpleural nodule posterior left upper lobe 10 mm greatest size.  He is s/p AAA repair on 01/24/2016.  Echo on 01/21/2016 revealed an EF of 65-70%.  Plan: 1.  Oncology:  Patient underwent CT guided biopsy of left upper quadrant mass on 01/22/2016.  Awaiting pathology.  Preliminary results indicate CD20 + lymphoma.  Hepatitis B core antibody IgM, hepatitis B surface antigen, and hepatitis C antibody negative.  Check hepatitis B core antibody total.  If pathology confirms a high grade lymphoma, consider mini-RCHOP.  Consider bone marrow for staging (if + and high grade lymphoma will need IT therapy).  Patient received Rituxan yesterday with  significant reaction (rigors, desaturations to 68%) despite premedications  Following his reaction, he received Benadryl 50 mg IV , Solumedrol 125 mg IV, and Demerol 12.5 mg IV and placed on a non-rebreather.  He received a total of 148 ml of planned 270 ml.  Patient returned back to baseline in approximately 48 minutes.  Decision made to hold chemotherapy over the weekend given significant reaction.  Will try again on Monday, 01/29/2016. Anticipate initiation of chemotherapy next week.  On allopurinol for tumor lysis.  CBC adequate.  2.  Fluids/electrolyes/nutrition:  Hypercalcemia s/p Zometa, Decadron, and calcitonin.  Calcium has improved from 14.8 to 10.0.  Uric acid 4.7 on allopurinol.  3.  Vascular:  Abdominal aortic aneurysm s/p repair on 11/o8/2017 secondary to risk of rupture.  4:  Code status:  DNR.   Lequita Asal, MD  01/27/2016, 11:16 AM

## 2016-01-27 NOTE — Progress Notes (Signed)
Dr Patsy Baltimore notified that patient had reaction to [place] last night. MD acknowledged.

## 2016-01-27 NOTE — Progress Notes (Signed)
[facility] NAME: Brian Hughes    MR#:  [id number]  DATE OF BIRTH:  03/02/1941  SUBJECTIVE:  CHIEF COMPLAINT:   Chief Complaint  Patient presents with  . Weakness   - s/p AAA repair 01/24/2016.  -Had infusion reaction yesterday - hypoxia, elevated BP , infusion d/ced half way, pt is doing better today still on 6 lit of o2   REVIEW OF SYSTEMS:  Review of Systems  Constitutional: Positive for malaise/fatigue and weight loss. Negative for chills and fever.  HENT: Negative for ear discharge, nosebleeds and tinnitus.   Eyes: Negative for blurred vision.  Respiratory: Negative for cough, shortness of breath and wheezing.   Cardiovascular: Positive for leg swelling. Negative for chest pain and palpitations.  Gastrointestinal: Positive for abdominal pain and constipation. Negative for diarrhea, nausea and vomiting.  Genitourinary: Negative for dysuria and urgency.  Musculoskeletal: Positive for joint pain. Negative for myalgias.  Skin: Negative for rash.  Neurological: Negative for dizziness, sensory change, speech change, focal weakness, seizures and headaches.  Psychiatric/Behavioral: Negative for depression.       Some confusion still present.    DRUG ALLERGIES:   Allergies  Allergen Reactions  . Bupropion Nausea Only and Nausea And Vomiting  . Flunisolide     Other reaction(s): Other (See Comments) Wheezing  . Lisinopril Cough  . Mometasone     Other reaction(s): Other (See Comments) Wheezing  . Terazosin     Other reaction(s): Other (See Comments) Fatigue  . Valsartan Cough    VITALS:  Blood pressure (!) 128/58, pulse 97, temperature 98.1 F (36.7 C), temperature source Oral, resp. rate 20, height 6\' 4"  (1.93 m), weight 101.6 kg (224 lb), SpO2 96 %.  PHYSICAL EXAMINATION:  Physical Exam  GENERAL:  75 y.o.-year-old patient lying in the bed with no acute distress. More alert EYES: Pupils equal, round,  reactive to light and accommodation. No scleral icterus. Extraocular muscles intact.  HEENT: Head atraumatic, normocephalic. Oropharynx and nasopharynx clear.  NECK:  Supple, no jugular venous distention. No thyroid enlargement, no tenderness.  LUNGS: Moderate breath sounds bilaterally, no wheezing, rales,rhonchi or crepitation. No use of accessory muscles of respiration. Diminished at the bases CARDIOVASCULAR: S1, S2 normal. No murmurs, rubs, or gallops.  ABDOMEN: Soft, tenderness in LUQ, abdomen appears distended. Hypoactive Bowel sounds present.  EXTREMITIES: No pedal edema, cyanosis, or clubbing.  NEUROLOGIC: Cranial nerves II through XII are intact. Muscle strength 5/5 in all extremities. Sensation intact. Gait not checked. Global weakness noted. PSYCHIATRIC: The patient is alert and oriented x 2-3. Intermittent confusion present. SKIN: No obvious rash, lesion, or ulcer.    LABORATORY PANEL:   CBC  Recent Labs Lab 01/27/16 0512  WBC 24.7*  HGB 10.9*  HCT 32.6*  PLT 374   ------------------------------------------------------------------------------------------------------------------  Chemistries   Recent Labs Lab 01/24/16 0412  01/27/16 0512  NA 134*  < > 138  K 4.2  < > 4.5  CL 96*  < > 102  CO2 29  < > 26  GLUCOSE 96  < > 124*  BUN 15  < > 17  CREATININE 0.70  < > 0.78  CALCIUM 10.5*  < > 10.0  MG 1.7  --   --   AST  --   < > 28  ALT  --   < > 11*  ALKPHOS  --   < > 67  BILITOT  --   < > 0.3  < > =  values in this interval not displayed. ------------------------------------------------------------------------------------------------------------------  Cardiac Enzymes No results for input(s): TROPONINI in the last 168 hours. ------------------------------------------------------------------------------------------------------------------  RADIOLOGY:  No results found.  EKG:   Orders placed or performed during the hospital encounter of 01/18/16  . ED EKG   . ED EKG  . EKG 12-Lead  . EKG 12-Lead    ASSESSMENT AND PLAN:   75 year old male with past medical history significant for asthma, hypertension presents to the hospital secondary to weight loss, abdominal pain and constipation going on for almost 4 months now.  # Abdominal mass-s/p infusion reaction -20 cm x 20 cm abdominal mass of unknown primary. Preliminary results- Lymphoma -started chemo 11/10 - had infusion reaction, s/p benadryl, solumedrol and NRB- better now,oncology recommended to hold chemo for the weekend until MOn - LDH is elevated, as is uric acid. -  CT guided biopsy done. Path results pending, likely lymphoma -- CT chest ordered as part of work up- no suspicious masses seen   #2 Hypercalcemia-hypercalcemia is secondary to underlying malignancy. -Receiving IV fluids. Also on calcitonin- discontinue calcitonin now -Zometa given 01/19/2016. Received 1 dose of decadron over the weekend. -monitor. Improving calcium and stable at 10.5.   #3 Abdominal distention and abdominal pain-  secondary to abdominal mass, also ileus noted on KUB as patient had been constipated - . Advance regular diet - encourage ambulation, no indication for NG tube as no nausea/vomiting or obstruction - on fentanyl patch low dose and continue the oral prn pain meds- watch carefully with pain meds - laxatives ordered   #4 Confusion- metabolic encephalopathy- likely from underlying infection and hypercalcemia- Resolved Improving, no focal deficits - CT head on admission normal   #5 AAA- infrarenal AAA of about 6.5 cm noted with no rupture but has has mural plaque Vascular surgery consulted- cardiac clearance appreciated- please see their note. -, ECHO ordered. Showing LVH, EF is [id number], mild diastolic dysfunction noted. -  S/P REPAIR ENDOVASCULAR DONE ON 01/24/16  #6 DVT prophylaxis-on Lovenox-  #7 Leukocytosis-differential with 90% neutrophils. Chest x-ray showing lingular infiltrate. CAP.  Was on Rocephin and azithromycin. Treated for 5 days total. -UA without any underlying infection Now elevated white count due to lymphoma and Decadron  #8 HTN- started lisinopril  SNF at discharge  All the records are reviewed and case discussed with Care Management/Social Workerr. Management plans discussed with the patient, family and they are in agreement.  CODE STATUS: DNR  TOTAL TIME TAKING CARE OF THIS PATIENT: 35 minutes. Nicholes Mango M.D on 01/27/2016 at 4:45 PM  Between 7am to 6pm - Pager - [phone]  After 6pm go to [email or web address] - password [email or web address] [facility]  Office  [phone]  CC: Primary care physician; No PCP Per Patient

## 2016-01-28 DIAGNOSIS — R109 Unspecified abdominal pain: Secondary | ICD-10-CM

## 2016-01-28 LAB — COMPREHENSIVE METABOLIC PANEL
ALBUMIN: 1.9 g/dL — AB (ref 3.5–5.0)
ALBUMIN: 1.9 g/dL — AB (ref 3.5–5.0)
ALK PHOS: 72 U/L (ref 38–126)
ALT: 11 U/L — AB (ref 17–63)
ALT: 11 U/L — AB (ref 17–63)
AST: 29 U/L (ref 15–41)
AST: 31 U/L (ref 15–41)
Alkaline Phosphatase: 66 U/L (ref 38–126)
Anion gap: 11 (ref 5–15)
Anion gap: 9 (ref 5–15)
BUN: 24 mg/dL — AB (ref 6–20)
BUN: 21 mg/dL — ABNORMAL HIGH (ref 6–20)
CALCIUM: 10.2 mg/dL (ref 8.9–10.3)
CHLORIDE: 100 mmol/L — AB (ref 101–111)
CHLORIDE: 100 mmol/L — AB (ref 101–111)
CO2: 24 mmol/L (ref 22–32)
CO2: 24 mmol/L (ref 22–32)
CREATININE: 0.9 mg/dL (ref 0.61–1.24)
CREATININE: 0.72 mg/dL (ref 0.61–1.24)
Calcium: 10.5 mg/dL — ABNORMAL HIGH (ref 8.9–10.3)
GFR calc Af Amer: >60 mL/min (ref >60)
GFR calc Af Amer: >60 mL/min (ref >60)
GFR calc non Af Amer: >60 mL/min (ref >60)
GLUCOSE: 115 mg/dL — AB (ref 65–99)
GLUCOSE: 117 mg/dL — AB (ref 65–99)
POTASSIUM: 4.2 mmol/L (ref 3.5–5.1)
Potassium: 3.9 mmol/L (ref 3.5–5.1)
SODIUM: 133 mmol/L — AB (ref 135–145)
Sodium: 135 mmol/L (ref 135–145)
Total Bilirubin: <0.1 mg/dL — ABNORMAL LOW (ref 0.3–1.2)
Total Bilirubin: 0.6 mg/dL (ref 0.3–1.2)
Total Protein: 5.9 g/dL — ABNORMAL LOW (ref 6.5–8.1)
Total Protein: 5.4 g/dL — ABNORMAL LOW (ref 6.5–8.1)

## 2016-01-28 LAB — CBC WITH DIFFERENTIAL/PLATELET
BASOS PCT: 0 %
Basophils Absolute: 0 10*3/uL (ref 0–0.1)
EOS PCT: 0 %
Eosinophils Absolute: 0 10*3/uL (ref 0–0.7)
HEMATOCRIT: 33.2 % — AB (ref 40.0–52.0)
Hemoglobin: 10.4 g/dL — ABNORMAL LOW (ref 13.0–18.0)
LYMPHS ABS: 0.6 10*3/uL — AB (ref 1.0–3.6)
Lymphocytes Relative: 2 %
MCH: 26.8 pg (ref 26.0–34.0)
MCHC: 31.3 g/dL — AB (ref 32.0–36.0)
MCV: 85.6 fL (ref 80.0–100.0)
MONO ABS: 3.7 10*3/uL — AB (ref 0.2–1.0)
MONOS PCT: 12 %
NEUTROS ABS: 26.5 10*3/uL — AB (ref 1.4–6.5)
Neutrophils Relative %: 86 %
PLATELETS: 428 10*3/uL (ref 150–440)
RBC: 3.88 MIL/uL — AB (ref 4.40–5.90)
RDW: 15.8 % — AB (ref 11.5–14.5)
WBC: 30.8 10*3/uL — ABNORMAL HIGH (ref 3.8–10.6)

## 2016-01-28 LAB — URIC ACID: URIC ACID, SERUM: 4.4 mg/dL (ref 4.4–7.6)

## 2016-01-28 LAB — LACTATE DEHYDROGENASE: LDH: 450 U/L — AB (ref 98–192)

## 2016-01-28 NOTE — Clinical Social Work Note (Signed)
CSW visited patient at bedside to discuss bed offers for STR. The patient indicated that his choice is [facility]. CSW indicated such in [place]. CSW will con't to follow for dc planning.  Santiago Bumpers, MSW, LCSW-A [phone]

## 2016-01-28 NOTE — Progress Notes (Signed)
[facility] Hematology/Oncology Progress Note  Date of admission: 01/18/2016  Hospital day:  01/28/2016  Chief Complaint: Brian Hughes is a 75 y.o. male who was admitted with a large abdominal mass, hypercalcemia, leukocytosis, and hypoxia.  Subjective:  Abdominal pain.  No further diarrhea.  Social History:  He lives in [place] with his son, Brian Hughes.  He states that Brian Hughes is his medical care power of attorney.  His wife of 48 years died of cancer .  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  The patient is alone today.  Allergies:  Allergies  Allergen Reactions  . Bupropion Nausea Only and Nausea And Vomiting  . Flunisolide     Other reaction(s): Other (See Comments) Wheezing  . Lisinopril Cough  . Mometasone     Other reaction(s): Other (See Comments) Wheezing  . Terazosin     Other reaction(s): Other (See Comments) Fatigue  . Valsartan Cough    Scheduled Medications: . acetaminophen  650 mg Oral Once  . allopurinol  300 mg Oral BID  . amLODipine  5 mg Oral Daily  . chlorhexidine  15 mL Mouth Rinse BID  . dexamethasone  4 mg Intravenous Once  . diphenhydrAMINE  50 mg Oral Once  . diphenhydrAMINE  50 mg Intravenous Once  . docusate sodium  100 mg Oral BID  . enoxaparin (LOVENOX) injection  40 mg Subcutaneous Q24H  . famotidine  20 mg Oral Once  . feeding supplement (ENSURE ENLIVE)  237 mL Oral TID BM  . fentaNYL  12.5 mcg Transdermal Q72H  . lisinopril  10 mg Oral Daily  . mouth rinse  15 mL Mouth Rinse BID  . pantoprazole  40 mg Oral Daily  . polyethylene glycol  17 g Oral Daily  . senna  1 tablet Oral BID  . sodium chloride flush  3 mL Intravenous Q12H    Review of Systems: GENERAL:  General fatigue.  No fevers or sweats.  Weight loss of 50 pounds over past 4 months. PERFORMANCE STATUS (ECOG):  1 HEENT:  No visual changes, runny nose, sore throat, mouth sores or  tenderness. Lungs: No shortness of breath or cough.  No hemoptysis. Cardiac:  No chest pain, palpitations, orthopnea, or PND. GI:  Left sided abdominal pain. Stool dark.  No nausea, vomiting, constipation, melena or hematochezia. GU:  No urgency, frequency, dysuria, or hematuria. Musculoskeletal:  Back pain.  No joint pain.  No muscle tenderness. Extremities:  No pain or swelling. Skin:  No rashes or skin changes. Neuro:  No headache, numbness or weakness, balance or coordination issues. Endocrine:  No diabetes, thyroid issues, hot flashes or night sweats. Psych:  No mood changes, depression or anxiety. Pain:  No focal pain. Review of systems:  All other systems reviewed and found to be negative.  Physical Exam: Blood pressure (!) 121/49, pulse (!) 102, temperature 97.7 F (36.5 C), temperature source Oral, resp. rate 19, height 6' 4"  (1.93 m), weight 228 lb (103.4 kg), SpO2 94 %.  GENERAL: Chronically fatigued slightly disheveled appearing gentleman lying on his left side in no acute distress. MENTAL STATUS:  Alert and oriented to person, place and time. HEAD:  [place] with male pattern baldness.  Normocephalic, atraumatic, face symmetric, no Cushingoid features. EYES:  Blue eyes.  Pupils equal round and reactive to light and accomodation.  No conjunctivitis or scleral icterus. ENT:  [place] in place.  Oropharynx  clear without lesion.  Upper dentures.  No lower teeth.  Tongue normal. Mucous membranes moist.  RESPIRATORY:  Decreased breath sounds at the bases.  Otherwise, clear to auscultation without rales, wheezes or rhonchi. CARDIOVASCULAR:  Regular rate and rhythm without murmur, rub or gallop. ABDOMEN:  Fully round. Soft, non-tender to deep palpation, with active bowel sounds, and no appreciable hepatosplenomegaly.  Left upper quadrant fullness. SKIN:  No rashes, ulcers or lesions.  Will healing groin incisions. EXTREMITIES:  Mild right lower extremity edema.  No skin discoloration or  tenderness.  No palpable cords. LYMPH NODES: No palpable cervical, supraclavicular, axillary or inguinal adenopathy  NEUROLOGICAL: Unremarkable.  Much more clear with organized thoughts today. PSYCH:  Appropriate.   Results for orders placed or performed during the hospital encounter of 01/18/16 (from the past 48 hour(s))  Lactate dehydrogenase     Status: Abnormal   Collection Time: 01/27/16  5:12 AM  Result Value Ref Range   LDH 555 (H) 98 - 192 U/L  Uric acid     Status: None   Collection Time: 01/27/16  5:12 AM  Result Value Ref Range   Uric Acid, Serum 4.7 4.4 - 7.6 mg/dL  Comprehensive metabolic panel     Status: Abnormal   Collection Time: 01/27/16  5:12 AM  Result Value Ref Range   Sodium 138 135 - 145 mmol/L   Potassium 4.5 3.5 - 5.1 mmol/L   Chloride 102 101 - 111 mmol/L   CO2 26 22 - 32 mmol/L   Glucose, Bld 124 (H) 65 - 99 mg/dL   BUN 17 6 - 20 mg/dL   Creatinine, Ser 0.78 0.61 - 1.24 mg/dL   Calcium 10.0 8.9 - 10.3 mg/dL   Total Protein 5.8 (L) 6.5 - 8.1 g/dL   Albumin 1.9 (L) 3.5 - 5.0 g/dL   AST 28 15 - 41 U/L   ALT 11 (L) 17 - 63 U/L   Alkaline Phosphatase 67 38 - 126 U/L   Total Bilirubin 0.3 0.3 - 1.2 mg/dL   GFR calc non Af Amer >60 >60 mL/min   GFR calc Af Amer >60 >60 mL/min    Comment: (NOTE) The eGFR has been calculated using the CKD EPI equation. This calculation has not been validated in all clinical situations. eGFR's persistently <60 mL/min signify possible Chronic Kidney Disease.    Anion gap 10 5 - 15  CBC with Differential     Status: Abnormal   Collection Time: 01/27/16  5:12 AM  Result Value Ref Range   WBC 24.7 (H) 3.8 - 10.6 K/uL   RBC 3.89 (L) 4.40 - 5.90 MIL/uL   Hemoglobin 10.9 (L) 13.0 - 18.0 g/dL   HCT 32.6 (L) 40.0 - 52.0 %   MCV 83.9 80.0 - 100.0 fL   MCH 27.9 26.0 - 34.0 pg   MCHC 33.3 32.0 - 36.0 g/dL   RDW 15.6 (H) 11.5 - 14.5 %   Platelets 374 150 - 440 K/uL   Neutrophils Relative % 94 %   Neutro Abs 23.3 (H) 1.4 - 6.5  K/uL   Lymphocytes Relative 1 %   Lymphs Abs 0.3 (L) 1.0 - 3.6 K/uL   Monocytes Relative 4 %   Monocytes Absolute 0.9 0.2 - 1.0 K/uL   Eosinophils Relative 0 %   Eosinophils Absolute 0.0 0 - 0.7 K/uL   Basophils Relative 1 %   Basophils Absolute 0.1 0 - 0.1 K/uL  Lactate dehydrogenase     Status: Abnormal  Collection Time: 01/28/16  4:21 AM  Result Value Ref Range   LDH 450 (H) 98 - 192 U/L  Uric acid     Status: None   Collection Time: 01/28/16  4:21 AM  Result Value Ref Range   Uric Acid, Serum 4.4 4.4 - 7.6 mg/dL  Comprehensive metabolic panel     Status: Abnormal   Collection Time: 01/28/16  4:21 AM  Result Value Ref Range   Sodium 135 135 - 145 mmol/L   Potassium 4.2 3.5 - 5.1 mmol/L   Chloride 100 (L) 101 - 111 mmol/L   CO2 24 22 - 32 mmol/L   Glucose, Bld 115 (H) 65 - 99 mg/dL   BUN 24 (H) 6 - 20 mg/dL   Creatinine, Ser 0.90 0.61 - 1.24 mg/dL   Calcium 10.5 (H) 8.9 - 10.3 mg/dL   Total Protein 5.9 (L) 6.5 - 8.1 g/dL   Albumin 1.9 (L) 3.5 - 5.0 g/dL   AST 29 15 - 41 U/L   ALT 11 (L) 17 - 63 U/L   Alkaline Phosphatase 66 38 - 126 U/L   Total Bilirubin <0.1 (L) 0.3 - 1.2 mg/dL   GFR calc non Af Amer >60 >60 mL/min   GFR calc Af Amer >60 >60 mL/min    Comment: (NOTE) The eGFR has been calculated using the CKD EPI equation. This calculation has not been validated in all clinical situations. eGFR's persistently <60 mL/min signify possible Chronic Kidney Disease.    Anion gap 11 5 - 15  CBC with Differential     Status: Abnormal   Collection Time: 01/28/16  4:21 AM  Result Value Ref Range   WBC 30.8 (H) 3.8 - 10.6 K/uL   RBC 3.88 (L) 4.40 - 5.90 MIL/uL   Hemoglobin 10.4 (L) 13.0 - 18.0 g/dL   HCT 33.2 (L) 40.0 - 52.0 %   MCV 85.6 80.0 - 100.0 fL   MCH 26.8 26.0 - 34.0 pg   MCHC 31.3 (L) 32.0 - 36.0 g/dL   RDW 15.8 (H) 11.5 - 14.5 %   Platelets 428 150 - 440 K/uL   Neutrophils Relative % 86 %   Lymphocytes Relative 2 %   Monocytes Relative 12 %   Eosinophils  Relative 0 %   Basophils Relative 0 %   Neutro Abs 26.5 (H) 1.4 - 6.5 K/uL   Lymphs Abs 0.6 (L) 1.0 - 3.6 K/uL   Monocytes Absolute 3.7 (H) 0.2 - 1.0 K/uL   Eosinophils Absolute 0.0 0 - 0.7 K/uL   Basophils Absolute 0.0 0 - 0.1 K/uL   Smear Review MORPHOLOGY UNREMARKABLE    No results found.  Assessment:  Brian Hughes is a 75 y.o. 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  LDH was 572 on 01/26/2016.  Abdomen and pelvic CT scan on 01/18/2016 revealed a 20 cm heterogeneously enhancing and partially cystic or necrotic soft tissue mass inseparable from the proximal stomach, spleen, body and tail of the pancreas, and left adrenal gland. Extensive upper abdominal and retrocrural lymphadenopathy. No evidence of liver metastatic disease or biliary obstruction.   There was a 6.4 cm infrarenal abdominal aortic aneurysm with no eevidence of acute rupture. There was extensive upper abdominal and root of the mesentery metastatic nodal disease but no distant metastatic disease identified in the pelvis, skeleton, or lower lungs.  Chest CT on 01/21/2016 revealed small bilateral pleural effusions and large left upper quadrant mass  adenopathy as reported on CT abdomen.  There was a subpleural nodule right lower lobe 18 x 14 mm with question additional subpleural nodule posterior left upper lobe 10 mm greatest size.  He is s/p AAA repair on 01/24/2016.  Echo on 01/21/2016 revealed an EF of 65-70%.  Plan: 1.  Oncology:  Patient underwent CT guided biopsy of left upper quadrant mass on 01/22/2016.  Pathology should be available tomorrow.  Preliminary results indicate CD20 + lymphoma.  Hepatitis B core antibody IgM, hepatitis B surface antigen, and hepatitis C antibody negative.  Hepatitis B core antibody total pending.    If pathology confirms a high grade lymphoma, consider mini-RCHOP.  Discuss bone marrow for staging (if +  and high grade lymphoma will need IT therapy) with patient.  He is in agreement.  Patient received Rituxan on 01/26/2016 with significant reaction (rigors, desaturations to 68%) despite premedications  Following his reaction, he received Benadryl 50 mg IV , Solumedrol 125 mg IV, and Demerol 12.5 mg IV and placed on a non-rebreather.  He received a total of 148 ml of planned 270 ml.  Patient returned back to baseline in approximately 48 minutes.  Decision made to hold chemotherapy over the weekend given significant reaction.    Discussed reinitiation of Rituxan on Monday, 01/29/2016, and possible mini-CHOP on Tuesday, 01/30/2016. Prefer obtaining bone marrow prior to chemotherapy.  Ensure ok with vascular for patient to come off Lovenox for bone marrow biopsy.  Patient on allopurinol for tumor lysis.  CBC adequate.  CBC count likely elevated post steroids.  2.  Fluids/electrolyes/nutrition:  Hypercalcemia s/p Zometa, Decadron, and calcitonin.  Calcium has improved from 14.8 to 10.0 to 10.5.  Uric acid 4.4 on allopurinol.  3.  Vascular:  Abdominal aortic aneurysm s/p repair on 01/24/2016.  Patient on prophylactic Lovenox.  4:  Disposition:  Patient will likely need a skilled nursing facility at discharge.  Given risk for tumor lysis if high grade lymphoma, would prefer patient stay hospitalized for 1-2 days after chemotherapy.  5.  Code status:  DNR.   Lequita Asal, MD  01/28/2016, 1:31 PM

## 2016-01-28 NOTE — Care Management Important Message (Signed)
Important Message  Patient Details  Name: Sevan Finseth MRN: [id number] Date of Birth: 1941-01-21   Medicare Important Message Given:  Yes    Bennett Ram A, RN 01/28/2016, 3:02 PM

## 2016-01-28 NOTE — Progress Notes (Signed)
[place] [facility] [place] NAME: Brian Hughes    MR#:  [id number]  DATE OF BIRTH:  06/08/1940  SUBJECTIVE:  CHIEF COMPLAINT:   Chief Complaint  Patient presents with  . Weakness   - s/p AAA repair 01/24/2016.  -Had infusion reaction 01/27/16 - hypoxia, elevated BP , infusion d/ced half way,  pt is doing better today, no other episodes of SOB  REVIEW OF SYSTEMS:  Review of Systems  Constitutional: Positive for malaise/fatigue and weight loss. Negative for chills and fever.  HENT: Negative for ear discharge, nosebleeds and tinnitus.   Eyes: Negative for blurred vision.  Respiratory: Negative for cough, shortness of breath and wheezing.   Cardiovascular: Positive for leg swelling. Negative for chest pain and palpitations.  Gastrointestinal: Positive for abdominal pain and constipation. Negative for diarrhea, nausea and vomiting.  Genitourinary: Negative for dysuria and urgency.  Musculoskeletal: Positive for joint pain. Negative for myalgias.  Skin: Negative for rash.  Neurological: Negative for dizziness, sensory change, speech change, focal weakness, seizures and headaches.  Psychiatric/Behavioral: Negative for depression.       Some confusion still present.    DRUG ALLERGIES:   Allergies  Allergen Reactions  . Bupropion Nausea Only and Nausea And Vomiting  . Flunisolide     Other reaction(s): Other (See Comments) Wheezing  . Lisinopril Cough  . Mometasone     Other reaction(s): Other (See Comments) Wheezing  . Terazosin     Other reaction(s): Other (See Comments) Fatigue  . Valsartan Cough    VITALS:  Blood pressure (!) 123/47, pulse 99, temperature 97.5 F (36.4 C), temperature source Oral, resp. rate 17, height 6\' 4"  (1.93 m), weight 103.4 kg (228 lb), SpO2 93 %.  PHYSICAL EXAMINATION:  Physical Exam  GENERAL:  75 y.o.-year-old patient lying in the bed with no acute distress. More alert EYES: Pupils equal, round,  reactive to light and accommodation. No scleral icterus. Extraocular muscles intact.  HEENT: Head atraumatic, normocephalic. Oropharynx and nasopharynx clear.  NECK:  Supple, no jugular venous distention. No thyroid enlargement, no tenderness.  LUNGS: Moderate breath sounds bilaterally, no wheezing, rales,rhonchi or crepitation. No use of accessory muscles of respiration. Diminished at the bases CARDIOVASCULAR: S1, S2 normal. No murmurs, rubs, or gallops.  ABDOMEN: Soft, tenderness and full in LUQ, abdomen appears distended. Hypoactive Bowel sounds present.  EXTREMITIES: No pedal edema, cyanosis, or clubbing.  NEUROLOGIC: Cranial nerves II through XII are intact. Muscle strength 5/5 in all extremities. Sensation intact. Gait not checked. Global weakness noted. PSYCHIATRIC: The patient is alert and oriented x 2-3. Intermittent confusion present. SKIN: No obvious rash, lesion, or ulcer.    LABORATORY PANEL:   CBC  Recent Labs Lab 01/28/16 0421  WBC 30.8*  HGB 10.4*  HCT 33.2*  PLT 428   ------------------------------------------------------------------------------------------------------------------  Chemistries   Recent Labs Lab 01/24/16 0412  01/28/16 0421  NA 134*  < > 135  K 4.2  < > 4.2  CL 96*  < > 100*  CO2 29  < > 24  GLUCOSE 96  < > 115*  BUN 15  < > 24*  CREATININE 0.70  < > 0.90  CALCIUM 10.5*  < > 10.5*  MG 1.7  --   --   AST  --   < > 29  ALT  --   < > 11*  ALKPHOS  --   < > 66  BILITOT  --   < > <0.1*  < > =  values in this interval not displayed. ------------------------------------------------------------------------------------------------------------------  Cardiac Enzymes No results for input(s): TROPONINI in the last 168 hours. ------------------------------------------------------------------------------------------------------------------  RADIOLOGY:  No results found.  EKG:   Orders placed or performed during the hospital encounter of  01/18/16  . ED EKG  . ED EKG  . EKG 12-Lead  . EKG 12-Lead    ASSESSMENT AND PLAN:   75 year old male with past medical history significant for asthma, hypertension presents to the hospital secondary to weight loss, abdominal pain and constipation going on for almost 4 months now.  # Abdominal mass-s/p infusion reaction -20 cm x 20 cm abdominal mass of unknown primary. Preliminary results- Lymphoma -started chemo 11/10 - had infusion reaction, s/p benadryl, solumedrol,demerol and NRB- better now,oncology recommended to hold chemo for the weekend until 11/13 -Reinitiation of Rituxan 11/13 and possible mini- CHOP on 11/14 -will discusss with vascular if pt can come off lovenox for bone marrow bx - LDH is elevated, as is uric acid. -  CT guided biopsy done. Path results pending, likely lymphoma -- CT chest ordered as part of work up- no suspicious masses seen - on allopurinol for tumor lysis   #2 Hypercalcemia-hypercalcemia is secondary to underlying malignancy. -Receiving IV fluids. Also on calcitonin- discontinue calcitonin now -Zometa given 01/19/2016. Received 1 dose of decadron over the weekend. -monitor. Improving calcium and stable at 10.5.   #3 Abdominal distention and abdominal pain-  secondary to abdominal mass, also ileus noted on KUB as patient had been constipated - . Advance regular diet - encourage ambulation, no indication for NG tube as no nausea/vomiting or obstruction - on fentanyl patch low dose and continue the oral prn pain meds- watch carefully with pain meds - laxatives ordered   #4 Confusion- metabolic encephalopathy- likely from underlying infection and hypercalcemia- Resolved Improving, no focal deficits - CT head on admission normal   #5 AAA- infrarenal AAA of about 6.5 cm noted with no rupture but has has mural plaque Vascular surgery consulted- cardiac clearance appreciated- please see their note. -, ECHO ordered. Showing LVH, EF is 123456, mild  diastolic dysfunction noted. -  S/P REPAIR ENDOVASCULAR DONE ON 01/24/16  #6 DVT prophylaxis-on Lovenox-  #7 Leukocytosis-differential with 90% neutrophils. Chest x-ray showing lingular infiltrate. CAP. Was on Rocephin and azithromycin. Treated for 5 days total. -UA without any underlying infection Now elevated white count due to lymphoma and Decadron  #8 HTN- started lisinopril  SNF at discharge -1-2 days after chemo - needs observation for possible tumor lysis after chemo  All the records are reviewed and case discussed with Care Management/Social Workerr. Management plans discussed with the patient, family and they are in agreement.  CODE STATUS: DNR  TOTAL TIME TAKING CARE OF THIS PATIENT: 35 minutes. Nicholes Mango M.D on 01/28/2016 at 8:41 PM  Between 7am to 6pm - Pager - [phone]  After 6pm go to [email or web address] - password [email or web address] [facility]  Office  [phone]  CC: Primary care physician; No PCP Per Patient

## 2016-01-29 ENCOUNTER — Inpatient Hospital Stay: Payer: Medicare Other

## 2016-01-29 ENCOUNTER — Ambulatory Visit: Payer: Medicare Other

## 2016-01-29 DIAGNOSIS — K921 Melena: Secondary | ICD-10-CM

## 2016-01-29 LAB — OCCULT BLOOD X 1 CARD TO LAB, STOOL: FECAL OCCULT BLD: POSITIVE — AB

## 2016-01-29 LAB — CBC WITH DIFFERENTIAL/PLATELET
BASOS PCT: 0 %
Basophils Absolute: 0 10*3/uL (ref 0–0.1)
EOS ABS: 0 10*3/uL (ref 0–0.7)
Eosinophils Relative: 0 %
HEMATOCRIT: 31.1 % — AB (ref 40.0–52.0)
HEMOGLOBIN: 10.1 g/dL — AB (ref 13.0–18.0)
LYMPHS ABS: 0.6 10*3/uL — AB (ref 1.0–3.6)
LYMPHS PCT: 3 %
MCH: 27.7 pg (ref 26.0–34.0)
MCHC: 32.5 g/dL (ref 32.0–36.0)
MCV: 85.3 fL (ref 80.0–100.0)
MONOS PCT: 14 %
Monocytes Absolute: 3 10*3/uL — ABNORMAL HIGH (ref 0.2–1.0)
NEUTROS ABS: 17.7 10*3/uL — AB (ref 1.4–6.5)
Neutrophils Relative %: 83 %
Platelets: 377 10*3/uL (ref 150–440)
RBC: 3.65 MIL/uL — ABNORMAL LOW (ref 4.40–5.90)
RDW: 16 % — ABNORMAL HIGH (ref 11.5–14.5)
WBC: 21.3 10*3/uL — ABNORMAL HIGH (ref 3.8–10.6)

## 2016-01-29 LAB — HEPATITIS B CORE ANTIBODY, TOTAL: Hep B Core Total Ab: NEGATIVE

## 2016-01-29 LAB — COMPREHENSIVE METABOLIC PANEL
ALK PHOS: 73 U/L (ref 38–126)
ALT: 10 U/L — AB (ref 17–63)
AST: 29 U/L (ref 15–41)
Albumin: 1.9 g/dL — ABNORMAL LOW (ref 3.5–5.0)
Anion gap: 10 (ref 5–15)
BILIRUBIN TOTAL: 0.4 mg/dL (ref 0.3–1.2)
BUN: 21 mg/dL — ABNORMAL HIGH (ref 6–20)
CALCIUM: 10.2 mg/dL (ref 8.9–10.3)
CO2: 26 mmol/L (ref 22–32)
CREATININE: 0.68 mg/dL (ref 0.61–1.24)
Chloride: 98 mmol/L — ABNORMAL LOW (ref 101–111)
Glucose, Bld: 87 mg/dL (ref 65–99)
Potassium: 4 mmol/L (ref 3.5–5.1)
Sodium: 134 mmol/L — ABNORMAL LOW (ref 135–145)
Total Protein: 5.7 g/dL — ABNORMAL LOW (ref 6.5–8.1)

## 2016-01-29 LAB — HEMOGLOBIN AND HEMATOCRIT, BLOOD
HCT: 26.2 % — ABNORMAL LOW (ref 40.0–52.0)
HEMATOCRIT: 25.1 % — AB (ref 40.0–52.0)
HEMOGLOBIN: 8.2 g/dL — AB (ref 13.0–18.0)
Hemoglobin: 8.4 g/dL — ABNORMAL LOW (ref 13.0–18.0)

## 2016-01-29 LAB — LACTATE DEHYDROGENASE: LDH: 472 U/L — ABNORMAL HIGH (ref 98–192)

## 2016-01-29 LAB — G-6-PD, QUANT, BLOOD AND RBC
G-6-PD, QUANT: 411 U/10*12{RBCs} — AB (ref 146–376)
RBC: 3.94 x10E6/uL — AB (ref 4.14–5.80)

## 2016-01-29 LAB — URIC ACID: Uric Acid, Serum: 3.6 mg/dL — ABNORMAL LOW (ref 4.4–7.6)

## 2016-01-29 LAB — PREPARE RBC (CROSSMATCH)

## 2016-01-29 MED ORDER — METHYLPREDNISOLONE SODIUM SUCC 125 MG IJ SOLR
125.0000 mg | Freq: Once | INTRAMUSCULAR | Status: AC
  Administered 2016-01-29: 125 mg via INTRAVENOUS
  Filled 2016-01-29: qty 2

## 2016-01-29 MED ORDER — RITUXIMAB CHEMO INJECTION 500 MG/50ML
100.0000 mg | Freq: Once | INTRAVENOUS | Status: DC
Start: 2016-01-29 — End: 2016-02-02
  Filled 2016-01-29: qty 10

## 2016-01-29 MED ORDER — SODIUM CHLORIDE 0.9 % IV SOLN
INTRAVENOUS | Status: DC
  Administered 2016-01-31: 1000 mL via INTRAVENOUS

## 2016-01-29 MED ORDER — FAMOTIDINE IN NACL 20-0.9 MG/50ML-% IV SOLN
20.0000 mg | Freq: Once | INTRAVENOUS | Status: AC
  Administered 2016-01-29: 20 mg via INTRAVENOUS
  Filled 2016-01-29 (×2): qty 50

## 2016-01-29 MED ORDER — LORAZEPAM 0.5 MG PO TABS
0.5000 mg | ORAL_TABLET | Freq: Four times a day (QID) | ORAL | Status: DC | PRN
  Administered 2016-01-29 – 2016-01-31 (×3): 0.5 mg via ORAL
  Filled 2016-01-29 (×3): qty 1

## 2016-01-29 MED ORDER — DIPHENHYDRAMINE HCL 50 MG/ML IJ SOLN: 50.0000 mg | Freq: Once | INTRAMUSCULAR | Status: DC

## 2016-01-29 MED ORDER — SODIUM CHLORIDE 0.9 % IV SOLN
Freq: Once | INTRAVENOUS | Status: AC
  Administered 2016-01-30: 22:00:00 via INTRAVENOUS

## 2016-01-29 MED ORDER — ACETAMINOPHEN 325 MG PO TABS
650.0000 mg | ORAL_TABLET | Freq: Once | ORAL | Status: AC
  Administered 2016-01-29: 14:00:00 650 mg via ORAL

## 2016-01-29 MED ORDER — SODIUM CHLORIDE 0.9 % IV SOLN
8.0000 mg/h | INTRAVENOUS | Status: DC
  Administered 2016-01-29 – 2016-01-31 (×3): 8 mg/h via INTRAVENOUS
  Filled 2016-01-29 (×3): qty 80

## 2016-01-29 NOTE — Progress Notes (Signed)
PT Cancellation Note  Patient Details Name: Brian Hughes MRN: [id number] DOB: [birth year]   Cancelled Treatment:    Reason Eval/Treat Not Completed: Patient at procedure or test/unavailable   Attempted to see pt this pm.  Nsg in and starting Chemo treatment.  Will continue as appropriate.   [name] 01/29/2016, 2:48 PM

## 2016-01-29 NOTE — Progress Notes (Signed)
Chaplains provided a collective spiritual care to the patient. We had a good conversation that was concluded by a prayer.

## 2016-01-29 NOTE — Progress Notes (Signed)
[facility]  Patient s/p successful endovascular AAA repair on 01/24/16. Unlikely to have GI bleed s/p repair endovascuarly. If repaired open - patient would have a greater chance of bleed.  Extremely rare for an Aorto-Duodenal fistula in acute setting. Even though rare, fistula formation would be found in chronic phase and associated with infection. Would recommend work up for GI bleed. Recommend CTA of Abdomen / Pelvis to assess AAA repair. Discussed with Dr. Mayme Genta, PA-C   01/29/2016 6:00 PM

## 2016-01-29 NOTE — Progress Notes (Signed)
Pt declining currently DNR/NMP; pt verbalized that he did not want staff to call his son or girlfriend at this time; pt taken to CT for CT Abd

## 2016-01-29 NOTE — Consult Note (Signed)
Lucilla Lame, MD [address] [place], [place] [address] Phone: [phone] Fax : [phone]  Consultation  Referring Provider:     No ref. provider found Primary Care Physician:  No PCP Per Patient Primary Gastroenterologist: None       Reason for Consultation:     Rectal bleeding  Date of Admission:  01/18/2016 Date of Consultation:  01/29/2016         HPI:   Brian Hughes is a 75 y.o. male who was admitted with a AAA repair on 01/24/2016. The patient then had a transfusion reaction on the 11th of this month with hypoxia and elevated blood pressure. The patient was also found to have a large abdominal mass that is thought to be a lymphoma. The patient is unable to give any history and is obtunded at the time of my interview today. The patient had a large rectal bleed with a drop in his hemoglobin from 10 to 8. The patient shakes his head when asked if he is having abdominal pain. Not much more history can be obtained from the patient. The patient was due to have a bone marrow biopsy tomorrow for his suspected lymphoma.  Past Medical History:  Diagnosis Date  . Asthma   . Cancer ([place])   . Hypertension     Past Surgical History:  Procedure Laterality Date  . PERIPHERAL VASCULAR CATHETERIZATION N/A 01/24/2016   Procedure: Endovascular Repair/Stent Graft;  Surgeon: Algernon Huxley, MD;  Location: [facility];  Service: Cardiovascular;  Laterality: N/A;    Prior to Admission medications   Medication Sig Start Date End Date Taking? Authorizing Provider  lisinopril-hydrochlorothiazide (PRINZIDE,ZESTORETIC) 10-12.5 MG tablet Take 1 tablet by mouth daily. 11/15/15  Yes Historical Provider, MD  pantoprazole (PROTONIX) 40 MG tablet Take 1 tablet by mouth daily. 11/15/15  Yes Historical Provider, MD  simethicone (GAS-X) 80 MG chewable tablet Chew 1 tablet (80 mg total) by mouth 3 (three) times daily. 12/22/15 12/21/16 Yes Harvest Dark, MD  sucralfate (CARAFATE) 1 g  tablet Take 1 tablet by mouth daily. 12/13/15  Yes Historical Provider, MD  VENTOLIN HFA 108 (90 Base) MCG/ACT inhaler Inhale 1-2 puffs into the lungs every 6 (six) hours as needed.  10/15/15  Yes Historical Provider, MD    Family History  Problem Relation Age of Onset  . Jaundice Father      Social History  Substance Use Topics  . Smoking status: Never Smoker  . Smokeless tobacco: Never Used  . Alcohol use No    Allergies as of 01/18/2016 - Review Complete 01/18/2016  Allergen Reaction Noted  . Bupropion Nausea Only and Nausea And Vomiting 03/01/2015  . Flunisolide  03/01/2015  . Lisinopril Cough 03/01/2015  . Mometasone  03/01/2015  . Terazosin  03/01/2015  . Valsartan Cough 03/01/2015    Review of Systems:    All systems reviewed and negative except where noted in HPI.   Physical Exam:  Vital signs in last 24 hours: Temp:  [97.5 F (36.4 C)-98.9 F (37.2 C)] 98.9 F (37.2 C) (11/13 1314) Pulse Rate:  [92-119] 102 (11/13 1536) Resp:  [18-23] 22 (11/13 1314) BP: (106-144)/(36-68) 129/36 (11/13 1536) SpO2:  [93 %-98 %] 97 % (11/13 1536) Last BM Date: 01/28/16 General: Obtunded  Head:  Normocephalic and atraumatic. Eyes:   No icterus.   ConjuncPowellPERRLA. Ears:  Normal auditory acuity. Neck:  Supple; no masses or thyroidomegaly Lungs: Labored. Lungs clear to auscultation bilaterally.   No wheezes, crackles,  or rhonchi.  Heart:  Regular rate and rhythm;  Without murmur, clicks, rubs or gallops Abdomen:  Soft, distended, positive tendernessl bowel sounds. No appreciable masses or hepatomegaly.  No rebound or guarding.  Rectal:  Not performed. Msk:  Symmetrical without gross deformities.    Extremities:  Without edema, cyanosis or clubbing. Neuro:  Obtunded   LAB RESULTS:  Recent Labs  01/27/16 0512 01/28/16 0421 01/29/16 0311 01/29/16 1643  WBC 24.7* 30.8* 21.3*  --   HGB 10.9* 10.4* 10.1* 8.4*  HCT 32.6* 33.2* 31.1* 26.2*  PLT 374 428 377  --     BMET  Recent Labs  01/28/16 0421 01/28/16 2142 01/29/16 0311  NA 135 133* 134*  K 4.2 3.9 4.0  CL 100* 100* 98*  CO2 24 24 26   GLUCOSE 115* 117* 87  BUN 24* 21* 21*  CREATININE 0.90 0.72 0.68  CALCIUM 10.5* 10.2 10.2   LFT  Recent Labs  01/29/16 0311  PROT 5.7*  ALBUMIN 1.9*  AST 29  ALT 10*  ALKPHOS 73  BILITOT 0.4   PT/INR No results for input(s): LABPROT, INR in the last 72 hours.  STUDIES: No results found.    Impression / Plan:   Brian Hughes is a 75 y.o. y/o male with With new onset of rectal bleeding. The patient's rectal bleeding is of concern with his recent history of AAA repair. The patient is not a candidate for any colonoscopy at this time since he is unable to drink a prep due to his lethargy. I have called to the vascular surgery personal on-call and asked that they come see the patient or at least give [place] some guidance as to what needs to be done next this patient.   Thank you for involving me in the care of this patient.      LOS: 11 days   Lucilla Lame, MD  01/29/2016, 5:22 PM   Note: This dictation was prepared with Dragon dictation along with smaller phrase technology. Any transcriptional errors that result from this process are unintentional.

## 2016-01-29 NOTE — Progress Notes (Signed)
Spoke to Dr Mike Gip about possible GI bleed after chemo pre meds given.  Order to hold chemo at this time.

## 2016-01-29 NOTE — Progress Notes (Signed)
Chaplain was making his rounds and visited with pt in room 123. Provided the ministry of prayer and a pastoral presence.

## 2016-01-29 NOTE — Progress Notes (Signed)
Nutrition Follow-up  DOCUMENTATION CODES:   Severe malnutrition in context of acute illness/injury  INTERVENTION:  Continue to encourage intake of adequate calories and protein at meals. Encouraged small, frequent meals.   Continue Ensure Enlive po TID, each supplement provides 350 kcal and 20 grams of protein.  NUTRITION DIAGNOSIS:   Malnutrition related to acute illness as evidenced by percent weight loss, energy intake < or equal to 50% for > or equal to 5 days.  Ongoing.  GOAL:   Patient will meet greater than or equal to 90% of their needs  Progressing but not met.  MONITOR:   PO intake, Supplement acceptance, Weight trends  REASON FOR ASSESSMENT:   Malnutrition Screening Tool    ASSESSMENT:   Brian Hughes  is a 75 y.o. male with a known history of Asthma, hypertension presents to the emergency room complaining of many months of vague abdominal pain. He has had worsening weakness some on and off confusion as per his friend at bedside  -Patient s/p repair endovascular on 11/8 for AAA. -Patient started chemo 11/10 but had infusion reaction. Plan for Rituxan 11/13 and possible mini-CHOP on 11/14 - patient and son agreeable.  Spoke with patient at bedside. He reports terrible appetite and that he does not want any food. He refused breakfast today and dinner last night. Offered to order meal for patient but he is refusing at this time. Endorses ongoing abdominal pain. Denies N/V or difficulty chewing/swallowing. Reports drinking 1 Ensure yesterday. Per RN patient drank 2 Ensure Enlive yesterday and 1 so far today.  Meal Completion: 25-100% per chart. Patient did not order breakfast this morning or dinner last night. In the past 24 hours patient has had 1640 kcal (71% minimum estimated kcal needs) and 89 grams protein (80% minimum estimated protein needs).   Medications reviewed and include: allopurinol, dexamethasone 4 mg once daily, Colace, famotidine,  methylprednisolone, pantoprazole, Miralax, senna, NS @ 75 ml/hr.  Labs reviewed: Sodium 134, Chloride 98, BUN 21, Albumin 1.9.  Discussed with RN. Poor intake likely related to patient being nervous right now.   Diet Order:  Diet regular Room service appropriate? Yes; Fluid consistency: Thin  Skin:  Reviewed, no issues  Last BM:  01/28/2016  Height:   Ht Readings from Last 1 Encounters:  01/24/16 6' 4"  (1.93 m)    Weight:   Wt Readings from Last 1 Encounters:  01/28/16 228 lb (103.4 kg)    Ideal Body Weight:     BMI:  Body mass index is 27.75 kg/m.  Estimated Nutritional Needs:   Kcal:  4098-1191 kcals/d  Protein:  111-140 g/d  Fluid:  >/= 2.3 L/d  EDUCATION NEEDS:   No education needs identified at this time  Willey Blade, MS, RD, LDN Pager: [phone] After Hours Pager: [phone]

## 2016-01-29 NOTE — Progress Notes (Addendum)
[facility] NAME: Brian Hughes    MR#:  [id number]  DATE OF BIRTH:  12-24-1940  SUBJECTIVE:  CHIEF COMPLAINT:   Chief Complaint  Patient presents with  . Weakness   - s/p AAA repair 01/24/2016.  -Had infusion reaction 01/27/16 - hypoxia, elevated BP , infusion d/ced half way,  pt is doing Fine today, on 4 L of oxygen via nasal cannula no other episodes of SOB Agreeable for chemotherapy and bone marrow biopsy if needed  REVIEW OF SYSTEMS:  Review of Systems  Constitutional: Positive for malaise/fatigue and weight loss. Negative for chills and fever.  HENT: Negative for ear discharge, nosebleeds and tinnitus.   Eyes: Negative for blurred vision.  Respiratory: Negative for cough, shortness of breath and wheezing.   Cardiovascular: Positive for leg swelling. Negative for chest pain and palpitations.  Gastrointestinal: Positive for abdominal pain and constipation. Negative for diarrhea, nausea and vomiting.  Genitourinary: Negative for dysuria and urgency.  Musculoskeletal: Positive for joint pain. Negative for myalgias.  Skin: Negative for rash.  Neurological: Negative for dizziness, sensory change, speech change, focal weakness, seizures and headaches.  Psychiatric/Behavioral: Negative for depression.       Some confusion still present.    DRUG ALLERGIES:   Allergies  Allergen Reactions  . Bupropion Nausea Only and Nausea And Vomiting  . Flunisolide     Other reaction(s): Other (See Comments) Wheezing  . Lisinopril Cough  . Mometasone     Other reaction(s): Other (See Comments) Wheezing  . Terazosin     Other reaction(s): Other (See Comments) Fatigue  . Valsartan Cough    VITALS:  Blood pressure 124/60, pulse (!) 104, temperature 97.6 F (36.4 C), temperature source Oral, resp. rate 18, height 6' 4"  (1.93 m), weight 103.4 kg (228 lb), SpO2 96 %.  PHYSICAL EXAMINATION:  Physical Exam  GENERAL:  75  y.o.-year-old patient lying in the bed with no acute distress. More alert EYES: Pupils equal, round, reactive to light and accommodation. No scleral icterus. Extraocular muscles intact.  HEENT: Head atraumatic, normocephalic. Oropharynx and nasopharynx clear.  NECK:  Supple, no jugular venous distention. No thyroid enlargement, no tenderness.  LUNGS: Moderate breath sounds bilaterally, no wheezing, rales,rhonchi or crepitation. No use of accessory muscles of respiration. Diminished at the bases CARDIOVASCULAR: S1, S2 normal. No murmurs, rubs, or gallops.  ABDOMEN: Soft, tenderness and full in LUQ, abdomen appears distended. Hypoactive Bowel sounds present.  EXTREMITIES: No pedal edema, cyanosis, or clubbing.  NEUROLOGIC: Cranial nerves II through XII are intact. Muscle strength 5/5 in all extremities. Sensation intact. Gait not checked. Global weakness noted. PSYCHIATRIC: The patient is alert and oriented x 2-3. Intermittent confusion present. SKIN: No obvious rash, lesion, or ulcer.    LABORATORY PANEL:   CBC  Recent Labs Lab 01/29/16 0311  WBC 21.3*  HGB 10.1*  HCT 31.1*  PLT 377   ------------------------------------------------------------------------------------------------------------------  Chemistries   Recent Labs Lab 01/24/16 0412  01/29/16 0311  NA 134*  < > 134*  K 4.2  < > 4.0  CL 96*  < > 98*  CO2 29  < > 26  GLUCOSE 96  < > 87  BUN 15  < > 21*  CREATININE 0.70  < > 0.68  CALCIUM 10.5*  < > 10.2  MG 1.7  --   --   AST  --   < > 29  ALT  --   < > 10*  ALKPHOS  --   < >  73  BILITOT  --   < > 0.4  < > = values in this interval not displayed. ------------------------------------------------------------------------------------------------------------------  Cardiac Enzymes No results for input(s): TROPONINI in the last 168 hours. ------------------------------------------------------------------------------------------------------------------  RADIOLOGY:    No results found.  EKG:   Orders placed or performed during the hospital encounter of 01/18/16  . ED EKG  . ED EKG  . EKG 12-Lead  . EKG 12-Lead    ASSESSMENT AND PLAN:   75 year old male with past medical history significant for asthma, hypertension presents to the hospital secondary to weight loss, abdominal pain and constipation going on for almost 4 months now.  # Abdominal mass-s/p infusion reaction -20 cm x 20 cm abdominal mass of unknown primary. Preliminary results- Lymphoma -started chemo 11/10 - had infusion reaction, s/p benadryl, solumedrol,demerol and NRB- better now,o on 4 L of oxygen via nasal cannula  -ncology recommended to hold chemo for the weekend until 11/13 -Reinitiation of Rituxan 11/13 and possible mini- CHOP on 11/14-patient and son are agreeable -Ive  discussed vascular PA -Maudie Mercury, pt can come off lovenox for bone marrow bx, recommended to resume soon after biopsy  - LDH is elevated, as is uric acid. -  CT guided biopsy done. Path results still pending, likely lymphoma -- CT chest ordered as part of work up- no suspicious masses seen - on allopurinol for tumor lysis-uric acid at 1.9 today   #2 Hypercalcemia-hypercalcemia is secondary to underlying malignancy. -Receiving IV fluids. Also on calcitonin- discontinue calcitonin now -Zometa given 01/19/2016. Received 1 dose of decadron over the weekend. -monitor. Improving calcium and stable at 10.5.-10.2   #3 Abdominal distention and abdominal pain-  secondary to abdominal mass, also ileus noted on KUB as patient had been constipated - . 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 -, ECHO ordered. Showing LVH, EF is 00%, mild diastolic dysfunction noted. -  S/P REPAIR ENDOVASCULAR DONE ON 01/24/16  #6 DVT prophylaxis-on Lovenox-40 sq, can be discontinued prior to bone marrow biopsy and resume after biopsy  #7 Leukocytosis-differential with 90% neutrophils. Chest x-ray showing lingular infiltrate. CAP. Was on Rocephin and azithromycin. Treated for 5 days total. -UA without any underlying infection Now elevated white count due to lymphoma and Decadron  #8 HTN- started lisinopril  SNF at discharge -1-2 days after chemo - needs observation for possible tumor lysis after chemo  All the records are reviewed and case discussed with Care Management/Social Workerr. Management plans discussed with the patient, son LESEAN WOOLVERTON  over [phone]  and they are in agreement.  CODE STATUS: DNR   Addendum   Pt has received solumedrol, pepcid iv - pre meds prior to chemo, RN called reported rectal bleed, noticed clotted blood  A/p   GI bleed  Held chemo, lovenox NPO, PPI gtt, GI consult  Notified oncology, vascular sx as pt just had AAA repair CT abd and pelvis ordered stat Bleeding scan if pt bleeds again Stat H n h and transfuse prn , hb 10.1 - 8.4 Call placed  to son, couldn't reach him , left VM to call back asap to update the change in clinical situatin  TOTAL CRITICAL CARE TIME TAKING CARE OF THIS PATIENT: 45 minutes. Nicholes Mango M.D on 01/29/2016 at 1:11 PM  Between 7am to 6pm - Pager - [phone]  After 6pm go to [email or web address] - password [email or web address] [facility]  Office  [phone]  CC: Primary care physician; No PCP Per Patient

## 2016-01-30 DIAGNOSIS — E8770 Fluid overload, unspecified: Secondary | ICD-10-CM

## 2016-01-30 DIAGNOSIS — Z515 Encounter for palliative care: Secondary | ICD-10-CM

## 2016-01-30 DIAGNOSIS — K921 Melena: Secondary | ICD-10-CM

## 2016-01-30 DIAGNOSIS — R1084 Generalized abdominal pain: Secondary | ICD-10-CM

## 2016-01-30 DIAGNOSIS — C8333 Diffuse large B-cell lymphoma, intra-abdominal lymph nodes: Principal | ICD-10-CM

## 2016-01-30 DIAGNOSIS — Z7189 Other specified counseling: Secondary | ICD-10-CM

## 2016-01-30 DIAGNOSIS — J90 Pleural effusion, not elsewhere classified: Secondary | ICD-10-CM

## 2016-01-30 DIAGNOSIS — R413 Other amnesia: Secondary | ICD-10-CM

## 2016-01-30 LAB — HEMOGLOBIN AND HEMATOCRIT, BLOOD
HCT: 24.9 % — ABNORMAL LOW (ref 40.0–52.0)
HCT: 23.2 % — ABNORMAL LOW (ref 40.0–52.0)
HCT: 24.4 % — ABNORMAL LOW (ref 40.0–52.0)
HEMATOCRIT: 24 % — AB (ref 40.0–52.0)
HEMATOCRIT: 25.7 % — AB (ref 40.0–52.0)
HEMOGLOBIN: 7.7 g/dL — AB (ref 13.0–18.0)
HEMOGLOBIN: 8.2 g/dL — AB (ref 13.0–18.0)
HEMOGLOBIN: 7.5 g/dL — AB (ref 13.0–18.0)
Hemoglobin: 8 g/dL — ABNORMAL LOW (ref 13.0–18.0)
Hemoglobin: 7.8 g/dL — ABNORMAL LOW (ref 13.0–18.0)

## 2016-01-30 LAB — LACTATE DEHYDROGENASE: LDH: 409 U/L — AB (ref 98–192)

## 2016-01-30 LAB — CBC WITH DIFFERENTIAL/PLATELET
BASOS ABS: 0 10*3/uL (ref 0–0.1)
Basophils Relative: 0 %
Eosinophils Absolute: 0 10*3/uL (ref 0–0.7)
Eosinophils Relative: 0 %
HEMATOCRIT: 27 % — AB (ref 40.0–52.0)
Hemoglobin: 8.8 g/dL — ABNORMAL LOW (ref 13.0–18.0)
LYMPHS PCT: 2 %
Lymphs Abs: 0.4 10*3/uL — ABNORMAL LOW (ref 1.0–3.6)
MCH: 27.4 pg (ref 26.0–34.0)
MCHC: 32.4 g/dL (ref 32.0–36.0)
MCV: 84.6 fL (ref 80.0–100.0)
MONO ABS: 1.8 10*3/uL — AB (ref 0.2–1.0)
Monocytes Relative: 7 %
NEUTROS ABS: 21.7 10*3/uL — AB (ref 1.4–6.5)
Neutrophils Relative %: 91 %
Platelets: 352 10*3/uL (ref 150–440)
RBC: 3.19 MIL/uL — AB (ref 4.40–5.90)
RDW: 15.9 % — ABNORMAL HIGH (ref 11.5–14.5)
WBC: 23.9 10*3/uL — ABNORMAL HIGH (ref 3.8–10.6)

## 2016-01-30 LAB — COMPREHENSIVE METABOLIC PANEL
ALBUMIN: 1.7 g/dL — AB (ref 3.5–5.0)
ALT: 10 U/L — ABNORMAL LOW (ref 17–63)
ANION GAP: 8 (ref 5–15)
AST: 26 U/L (ref 15–41)
Alkaline Phosphatase: 60 U/L (ref 38–126)
BILIRUBIN TOTAL: 0.4 mg/dL (ref 0.3–1.2)
BUN: 30 mg/dL — ABNORMAL HIGH (ref 6–20)
CO2: 28 mmol/L (ref 22–32)
Calcium: 10.1 mg/dL (ref 8.9–10.3)
Chloride: 100 mmol/L — ABNORMAL LOW (ref 101–111)
Creatinine, Ser: 0.65 mg/dL (ref 0.61–1.24)
GFR calc Af Amer: >60 mL/min (ref >60)
GFR calc non Af Amer: >60 mL/min (ref >60)
GLUCOSE: 137 mg/dL — AB (ref 65–99)
POTASSIUM: 4.3 mmol/L (ref 3.5–5.1)
SODIUM: 136 mmol/L (ref 135–145)
TOTAL PROTEIN: 5.7 g/dL — AB (ref 6.5–8.1)

## 2016-01-30 LAB — URIC ACID: URIC ACID, SERUM: 3.6 mg/dL — AB (ref 4.4–7.6)

## 2016-01-30 NOTE — Progress Notes (Signed)
Lucilla Lame, MD [facility]   [address], [place], [place] [address] Phone: [phone] Fax : [phone]   Subjective: The patient had rectal bleeding yesterday with residual black stools today. The patient was unable to give any history yesterday but is much more awake today. He denies any abdominal pain. There is no report of any nausea vomiting. The patient has been found to have lymphoma with a large abdominal mass.   Objective: Vital signs in last 24 hours: Vitals:   01/30/16 0407 01/30/16 0500 01/30/16 1433 01/30/16 1437  BP: (!) 139/58   (!) 142/57  Pulse: 94  (!) 101 95  Resp: 17   20  Temp: 97.5 F (36.4 C)   97.8 F (36.6 C)  TempSrc: Oral   Oral  SpO2: 95%  (!) 84% 96%  Weight:  243 lb (110.2 kg)    Height:       Weight change:   Intake/Output Summary (Last 24 hours) at 01/30/16 1541 Last data filed at 01/30/16 1438  Gross per 24 hour  Intake           5094.5 ml  Output             1725 ml  Net           3369.5 ml     Exam: Heart:: Regular rate and rhythm Lungs: normal Abdomen: Distended with positive bowel sounds   Lab Results: @LABTEST2 @ Micro Results: No results found for this or any previous visit (from the past 240 hour(s)). Studies/Results: Ct Abdomen Pelvis Wo Contrast  Result Date: 01/29/2016 CLINICAL DATA:  Large abdominal mass believed be lymphoma with rectal bleeding and drop in hemoglobin from 10 down to 8. Recent CT-guided left upper quadrant mass for biopsy on 12/22/2015. EXAM: CT ABDOMEN AND PELVIS WITHOUT CONTRAST TECHNIQUE: Multidetector CT imaging of the abdomen and pelvis was performed following the standard protocol without IV contrast. COMPARISON:  01/22/2016 CT, 01/18/2016 CT FINDINGS: Lower chest: Development of moderate right pleural effusion and small left pleural effusion since previous study with adjacent compressive atelectasis. Small pleural fluid loculations along the periphery of the left lung base. Cardiac chambers  are normal in size with coronary arteriosclerosis. Aortic atherosclerosis. Hepatobiliary: No focal liver abnormality is seen. Status post cholecystectomy. No biliary dilatation. Pancreas: Peripancreatic mass, at the center and left upper quadrant is again seen, difficult to separate from medial aspect of the spleen, body and tail of the pancreas, left adrenal gland and gastric body, further limited given lack of IV contrast. This is not changed significantly in size measuring approximately 18 x 19.4 x 14.5 cm. No hematoma status post biopsy. Areas of necrosis are noted in the upper outer aspect of this mass as well centrally. Spleen: As above Adrenals/Urinary Tract: Left adrenal gland was involved by the left upper quadrant masslike abnormality. Right adrenal gland is unremarkable. There is an exophytic 1.6 cm simple appearing cyst off the lower pole of the right kidney. No obstructive uropathy. Urinary bladder is unremarkable. Stomach/Bowel: There is no bowel obstruction or perforation. There is colonic diverticulosis. Vascular/Lymphatic: Gastrohepatic, retrocrural, celiac and mesenteric lymphadenopathy is again noted without significant interval change, largest is para-aortic/ retroperitoneal in the upper abdomen measuring up to 3.5 cm, series 3 image 40. The patient has undergone endovascular aortic stent grafting of the 6.4 cm in diameter infrarenal abdominal aortic aneurysm with bicommon iliac stents. Reproductive: Partially visualized pelvic penile pump and prosthesis Other:  Small volume ascites along the paracolic gutters. Musculoskeletal: No  acute or suspicious osseous lesions. IMPRESSION: New findings since prior exam include moderate right and small left pleural effusions at each lung base with bibasilar atelectasis and infrarenal abdominal aortic aneurysm endovascular stent grafting with stenting of both common iliac arteries. Extensive colonic diverticulosis. Unchanged presumed lymph node mass in the  left upper quadrant with extensive gastrohepatic, retrocrural, celiac and mesenteric lymphadenopathy unchanged in appearance. 1.6 cm lower pole right renal cyst. Electronically Signed   By: Ashley Royalty M.D.   On: 01/29/2016 18:38   Medications: I have reviewed the patient's current medications. Scheduled Meds: . sodium chloride   Intravenous Once  . acetaminophen  650 mg Oral Once  . allopurinol  300 mg Oral BID  . amLODipine  5 mg Oral Daily  . chlorhexidine  15 mL Mouth Rinse BID  . dexamethasone  4 mg Intravenous Once  . docusate sodium  100 mg Oral BID  . famotidine  20 mg Oral Once  . feeding supplement (ENSURE ENLIVE)  237 mL Oral TID BM  . fentaNYL  12.5 mcg Transdermal Q72H  . lisinopril  10 mg Oral Daily  . mouth rinse  15 mL Mouth Rinse BID  . polyethylene glycol  17 g Oral Daily  . riTUXimab (RITUXAN) IV infusion  100 mg Intravenous Once  . senna  1 tablet Oral BID  . sodium chloride flush  3 mL Intravenous Q12H   Continuous Infusions: . sodium chloride 75 mL/hr at 01/30/16 0451  . sodium chloride    . pantoprozole (PROTONIX) infusion 8 mg/hr (01/30/16 0310)   PRN Meds:.acetaminophen **OR** acetaminophen, albuterol, alum & mag hydroxide-simeth, bisacodyl, fentaNYL (SUBLIMAZE) injection, HYDROcodone-acetaminophen, HYDROmorphone (DILAUDID) injection, lactulose, LORazepam, meperidine (DEMEROL) injection, metoprolol, oxyCODONE **OR** oxyCODONE, promethazine   Assessment: Active Problems:   Hypercalcemia   Protein-calorie malnutrition, severe   Hematochezia    Plan: This patient has had black stools with a drop in his hemoglobin. The patient will be set up for an EGD for tomorrow. The patient has been explained the plan and agrees with it. Dr. Vicente Males will be performing the procedure.   LOS: 12 days   Linna Thebeau 01/30/2016, 3:41 PM

## 2016-01-30 NOTE — Progress Notes (Signed)
Events over the weekend and since have been noted. [name] is seen today sitting up in bed, slightly Sob at rest, he is alert,a wake and communicative, although he has some memory lapses. He had a severe hypersensitivity reaction to Rituxan , despite premedication with Benadryl, Decadron and Pepcid. He also dropped his Hgb from 11- 7.7 and was noted to have large blood clots in stool yesterday and now with melena.  His vitals now are stable He is being transfused PRBC His platelet count, uric acid, LDH, and creatinine, calcium are stable  A CT abdomen ( non -cotrast) showed no intraabdominal bleed.  AAA repair site is stable as evaluated by vascular  He is seen by GI - plan for EGD tomorrow He is off Lovenox.  I called Dr. 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 No disease above diaphragm, bone marrow involvement is unknown. He is at least stage II, but has a  high risk prognostic score  Bone marrow biopsy will likely not alter management of his disease.  SOB- development of pleural effusion- from fluid overload, suggest careful diuresis.  Severe hypersensitivity reaction to Rituxan, cannot afford to re- challenge him now given the acute GI bleed. Also, high dose steroids or chemotherapy will pose a very high risk of bleeding, CHF and cytopenias.  Gi bleed very likely from lymphoma involvement of the stomach, his mass is contiguous with stomach.-EGD pending  Given all the above, his likelihood of benefitting from aggressive lymphoma directed thearpy is very  small. Palliative radiation to the spleen could be considered to help relieve his pain, but if the logistscs of delivering radiation causes more discomfort to him, radiation can be omitted and pain regimen can  be escalated. He is hospice appropriate- agree with consultation to palliative care.  He is DNR, family aware of his grave prognosis. [name] seems to understand what I explained regarding his prognosis, but I am unsure that he will remember this discussion after a while.  At this time, his son remains his next of kin for health care decisions.

## 2016-01-30 NOTE — Progress Notes (Signed)
Pt did not have any bleeding or BM this shift.  NM called and asked if test can be cancelled.  Note left with next shift. Dorna Bloom RN

## 2016-01-30 NOTE — Progress Notes (Signed)
Radiation Oncology Follow up Note  Name: Brian Hughes   Date:   01/18/2016 MRN:  [id number] DOB: 1940/03/29    This 75 y.o. male presents to the clinic today for reevaluation for palliative radiation therapy to the abdomen for diffuse B-cell lymphoma.  REFERRING PROVIDER: No ref. provider found  HPI: Patient is a 75 year old male initially consult back 01/25/2016 for extremely large abdominal mass which eventually turned out to be a diffuse B-cell lymphoma. He also had repair of abdominal aortic aneurysm.Marland KitchenCT-guided biopsy of the mass showed high-grade CD20 positive B-cell lymphoma and patient was started on Rituxan with significant reaction including desaturations to 60% despite premedication. His hypercalcemia has improved. This time I discussed the case with medical oncology that would like to hold any further chemotherapy or Rituxan and of asked me to initiate palliative radiation therapy based on his significant abdominal pain and discomfort. He is seen today in his hospital room is doing fairly well states that he's had some black tarry stools and GI is investigating.   COMPLICATIONS OF TREATMENT: none  FOLLOW UP COMPLIANCE: keeps appointments   PHYSICAL EXAM:  BP (!) 139/58 (BP Location: Left Arm)   Pulse 94   Temp 97.5 F (36.4 C) (Oral)   Resp 17   Ht 6\' 4"  (1.93 m)   Wt 243 lb (110.2 kg)   SpO2 95%   BMI 29.58 kg/m  Well-developed male in NAD and moderate pain discomfort. He has extremely large distended abdomen. Well-developed well-nourished patient in NAD. HEENT reveals PERLA, EOMI, discs not visualized.  Oral cavity is clear. No oral mucosal lesions are identified. Neck is clear without evidence of cervical or supraclavicular adenopathy. Lungs are clear to A&P. Cardiac examination is essentially unremarkable with regular rate and rhythm without murmur rub or thrill. Abdomen is benign with no organomegaly or masses noted. Motor sensory and DTR levels are equal and  symmetric in the upper and lower extremities. Cranial nerves II through XII are grossly intact. Proprioception is intact. No peripheral adenopathy or edema is identified. No motor or sensory levels are noted. Crude visual fields are within normal range.  RADIOLOGY RESULTS: CT scan is again reviewed  PLAN: At this time I to go ahead with a short course of palliative radiation therapy. Based on the high-grade nature of this lesion I believe 1500 cGy in 10 fractions to a large abdominal field using three-dimensional treatment planning would be of palliative benefit. I percent set up and ordered CT simulation for tomorrow. Risks and benefits of treatment including dropping blood counts, nausea, fatigue skin reaction all were discussed in detail with the patient. He seems to comprehend my treatment plan well and has consented to treatment.  I would like to take this opportunity to thank you for allowing me to participate in the care of your patient.Armstead Peaks., MD

## 2016-01-30 NOTE — Progress Notes (Signed)
CH made a follow up visit. Pt was in good spirits and more present than previous visits. Pt stated that his situation was in God's hands and he was not worried. Pt did not require prayer or spiritual care at this time. CH is available for follow up as needed.    01/30/16 1500  Clinical Encounter Type  Visited With Patient;Health care provider  Visit Type Follow-up;Spiritual support  Referral From Nurse  Spiritual Encounters  Spiritual Needs Emotional

## 2016-01-30 NOTE — Progress Notes (Signed)
[facility] Hematology/Oncology Progress Note  Date of admission: 01/18/2016  Hospital day:  01/29/2016  Chief Complaint: Brian Hughes is a 75 y.o. male who was admitted with a large abdominal mass, hypercalcemia, leukocytosis, and hypoxia.  Subjective:  Abdominal pain.   Social History:  He lives in [place] with his son, [name].  He states that [name] is his medical care power of attorney.  His wife of 78 years died of cancer .  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  The patient is alone today.  Allergies:  Allergies  Allergen Reactions  . Bupropion Nausea Only and Nausea And Vomiting  . Flunisolide     Other reaction(s): Other (See Comments) Wheezing  . Lisinopril Cough  . Mometasone     Other reaction(s): Other (See Comments) Wheezing  . Terazosin     Other reaction(s): Other (See Comments) Fatigue  . Valsartan Cough    Scheduled Medications: . sodium chloride   Intravenous Once  . acetaminophen  650 mg Oral Once  . allopurinol  300 mg Oral BID  . amLODipine  5 mg Oral Daily  . chlorhexidine  15 mL Mouth Rinse BID  . dexamethasone  4 mg Intravenous Once  . docusate sodium  100 mg Oral BID  . famotidine  20 mg Oral Once  . feeding supplement (ENSURE ENLIVE)  237 mL Oral TID BM  . fentaNYL  12.5 mcg Transdermal Q72H  . lisinopril  10 mg Oral Daily  . mouth rinse  15 mL Mouth Rinse BID  . polyethylene glycol  17 g Oral Daily  . riTUXimab (RITUXAN) IV infusion  100 mg Intravenous Once  . senna  1 tablet Oral BID  . sodium chloride flush  3 mL Intravenous Q12H    Review of Systems: GENERAL:  General fatigue.  No fevers or sweats.  Weight loss of 50 pounds over past 4 months. PERFORMANCE STATUS (ECOG):  1 HEENT:  No visual changes, runny nose, sore throat, mouth sores or tenderness. Lungs: No shortness of breath or cough.  No hemoptysis. Cardiac:  No chest pain, palpitations,  orthopnea, or PND. GI:  Left sided abdominal pain. No nausea, vomiting, constipation, melena or hematochezia. GU:  No urgency, frequency, dysuria, or hematuria. Musculoskeletal:  Back pain.  No joint pain.  No muscle tenderness. Extremities:  No pain or swelling. Skin:  No rashes or skin changes. Neuro:  No headache, numbness or weakness, balance or coordination issues. Endocrine:  No diabetes, thyroid issues, hot flashes or night sweats. Psych:  No mood changes, depression or anxiety. Pain:  No focal pain. Review of systems:  All other systems reviewed and found to be negative.  Physical Exam: Blood pressure (!) 141/63, pulse 94, temperature 97.9 F (36.6 C), temperature source Oral, resp. rate (!) 22, height 6' 4"  (1.93 m), weight 228 lb (103.4 kg), SpO2 92 %.  GENERAL: Chronically fatigued slightly disheveled appearing gentleman lying in bed in no acute distress. MENTAL STATUS:  Alert and ointeractive. HEAD:  Lilyan Punt with male pattern baldness.  Normocephalic, atraumatic, face symmetric, no Cushingoid features. EYES:  Blue eyes.  Pupils equal round and reactive to light and accomodation.  No conjunctivitis or scleral icterus. ENT:  [place] in place.  Oropharynx clear without lesion.  Upper dentures.  No lower teeth.  Tongue normal. Mucous membranes moist.  RESPIRATORY:  Decreased breath sounds at the bases.  No rales,  wheezes or rhonchi. CARDIOVASCULAR:  Regular rate and rhythm without murmur, rub or gallop. ABDOMEN:  Fully round. Soft, non-tender to deep palpation.  Active bowel sounds.  No hepatosmegaly.  Left upper quadrant fullness. SKIN:  No rashes, ulcers or lesions.  Will healing groin incisions. EXTREMITIES:  Mild right lower extremity edema.  No skin discoloration or tenderness.  No palpable cords. LYMPH NODES: No palpable cervical, supraclavicular, axillary or inguinal adenopathy  NEUROLOGICAL: Unremarkable.  Answers questions appropriately. PSYCH:  Appropriate.   Results for  orders placed or performed during the hospital encounter of 01/18/16 (from the past 48 hour(s))  Lactate dehydrogenase     Status: Abnormal   Collection Time: 01/28/16  4:21 AM  Result Value Ref Range   LDH 450 (H) 98 - 192 U/L  Uric acid     Status: None   Collection Time: 01/28/16  4:21 AM  Result Value Ref Range   Uric Acid, Serum 4.4 4.4 - 7.6 mg/dL  Comprehensive metabolic panel     Status: Abnormal   Collection Time: 01/28/16  4:21 AM  Result Value Ref Range   Sodium 135 135 - 145 mmol/L   Potassium 4.2 3.5 - 5.1 mmol/L   Chloride 100 (L) 101 - 111 mmol/L   CO2 24 22 - 32 mmol/L   Glucose, Bld 115 (H) 65 - 99 mg/dL   BUN 24 (H) 6 - 20 mg/dL   Creatinine, Ser 0.90 0.61 - 1.24 mg/dL   Calcium 10.5 (H) 8.9 - 10.3 mg/dL   Total Protein 5.9 (L) 6.5 - 8.1 g/dL   Albumin 1.9 (L) 3.5 - 5.0 g/dL   AST 29 15 - 41 U/L   ALT 11 (L) 17 - 63 U/L   Alkaline Phosphatase 66 38 - 126 U/L   Total Bilirubin <0.1 (L) 0.3 - 1.2 mg/dL   GFR calc non Af Amer >60 >60 mL/min   GFR calc Af Amer >60 >60 mL/min    Comment: (NOTE) The eGFR has been calculated using the CKD EPI equation. This calculation has not been validated in all clinical situations. eGFR's persistently <60 mL/min signify possible Chronic Kidney Disease.    Anion gap 11 5 - 15  CBC with Differential     Status: Abnormal   Collection Time: 01/28/16  4:21 AM  Result Value Ref Range   WBC 30.8 (H) 3.8 - 10.6 K/uL   RBC 3.88 (L) 4.40 - 5.90 MIL/uL   Hemoglobin 10.4 (L) 13.0 - 18.0 g/dL   HCT 33.2 (L) 40.0 - 52.0 %   MCV 85.6 80.0 - 100.0 fL   MCH 26.8 26.0 - 34.0 pg   MCHC 31.3 (L) 32.0 - 36.0 g/dL   RDW 15.8 (H) 11.5 - 14.5 %   Platelets 428 150 - 440 K/uL   Neutrophils Relative % 86 %   Lymphocytes Relative 2 %   Monocytes Relative 12 %   Eosinophils Relative 0 %   Basophils Relative 0 %   Neutro Abs 26.5 (H) 1.4 - 6.5 K/uL   Lymphs Abs 0.6 (L) 1.0 - 3.6 K/uL   Monocytes Absolute 3.7 (H) 0.2 - 1.0 K/uL   Eosinophils  Absolute 0.0 0 - 0.7 K/uL   Basophils Absolute 0.0 0 - 0.1 K/uL   Smear Review MORPHOLOGY UNREMARKABLE   Hepatitis B core antibody, total     Status: None   Collection Time: 01/28/16  4:21 AM  Result Value Ref Range   Hep B Core Total Ab Negative Negative  Comment: (NOTE) Performed At: [facility] [place], [place] [id number] Lindon Romp MD QQ:[id number]   Comprehensive metabolic panel     Status: Abnormal   Collection Time: 01/28/16  9:42 PM  Result Value Ref Range   Sodium 133 (L) 135 - 145 mmol/L   Potassium 3.9 3.5 - 5.1 mmol/L   Chloride 100 (L) 101 - 111 mmol/L   CO2 24 22 - 32 mmol/L   Glucose, Bld 117 (H) 65 - 99 mg/dL   BUN 21 (H) 6 - 20 mg/dL   Creatinine, Ser 0.72 0.61 - 1.24 mg/dL   Calcium 10.2 8.9 - 10.3 mg/dL   Total Protein 5.4 (L) 6.5 - 8.1 g/dL   Albumin 1.9 (L) 3.5 - 5.0 g/dL   AST 31 15 - 41 U/L   ALT 11 (L) 17 - 63 U/L   Alkaline Phosphatase 72 38 - 126 U/L   Total Bilirubin 0.6 0.3 - 1.2 mg/dL   GFR calc non Af Amer >60 >60 mL/min   GFR calc Af Amer >60 >60 mL/min    Comment: (NOTE) The eGFR has been calculated using the CKD EPI equation. This calculation has not been validated in all clinical situations. eGFR's persistently <60 mL/min signify possible Chronic Kidney Disease.    Anion gap 9 5 - 15  Lactate dehydrogenase     Status: Abnormal   Collection Time: 01/29/16  3:11 AM  Result Value Ref Range   LDH 472 (H) 98 - 192 U/L  Uric acid     Status: Abnormal   Collection Time: 01/29/16  3:11 AM  Result Value Ref Range   Uric Acid, Serum 3.6 (L) 4.4 - 7.6 mg/dL  Comprehensive metabolic panel     Status: Abnormal   Collection Time: 01/29/16  3:11 AM  Result Value Ref Range   Sodium 134 (L) 135 - 145 mmol/L   Potassium 4.0 3.5 - 5.1 mmol/L   Chloride 98 (L) 101 - 111 mmol/L   CO2 26 22 - 32 mmol/L   Glucose, Bld 87 65 - 99 mg/dL   BUN 21 (H) 6 - 20 mg/dL   Creatinine, Ser 0.68 0.61 - 1.24 mg/dL   Calcium 10.2 8.9  - 10.3 mg/dL   Total Protein 5.7 (L) 6.5 - 8.1 g/dL   Albumin 1.9 (L) 3.5 - 5.0 g/dL   AST 29 15 - 41 U/L   ALT 10 (L) 17 - 63 U/L   Alkaline Phosphatase 73 38 - 126 U/L   Total Bilirubin 0.4 0.3 - 1.2 mg/dL   GFR calc non Af Amer >60 >60 mL/min   GFR calc Af Amer >60 >60 mL/min    Comment: (NOTE) The eGFR has been calculated using the CKD EPI equation. This calculation has not been validated in all clinical situations. eGFR's persistently <60 mL/min signify possible Chronic Kidney Disease.    Anion gap 10 5 - 15  CBC with Differential     Status: Abnormal   Collection Time: 01/29/16  3:11 AM  Result Value Ref Range   WBC 21.3 (H) 3.8 - 10.6 K/uL   RBC 3.65 (L) 4.40 - 5.90 MIL/uL   Hemoglobin 10.1 (L) 13.0 - 18.0 g/dL   HCT 31.1 (L) 40.0 - 52.0 %   MCV 85.3 80.0 - 100.0 fL   MCH 27.7 26.0 - 34.0 pg   MCHC 32.5 32.0 - 36.0 g/dL   RDW 16.0 (H) 11.5 - 14.5 %   Platelets 377 150 - 440  K/uL   Neutrophils Relative % 83 %   Lymphocytes Relative 3 %   Monocytes Relative 14 %   Eosinophils Relative 0 %   Basophils Relative 0 %   Neutro Abs 17.7 (H) 1.4 - 6.5 K/uL   Lymphs Abs 0.6 (L) 1.0 - 3.6 K/uL   Monocytes Absolute 3.0 (H) 0.2 - 1.0 K/uL   Eosinophils Absolute 0.0 0 - 0.7 K/uL   Basophils Absolute 0.0 0 - 0.1 K/uL   Smear Review MORPHOLOGY UNREMARKABLE   Occult blood card to lab, stool RN will collect     Status: Abnormal   Collection Time: 01/29/16  3:35 PM  Result Value Ref Range   Fecal Occult Bld POSITIVE (A) NEGATIVE  Hemoglobin and hematocrit, blood     Status: Abnormal   Collection Time: 01/29/16  4:43 PM  Result Value Ref Range   Hemoglobin 8.4 (L) 13.0 - 18.0 g/dL   HCT 26.2 (L) 40.0 - 52.0 %  Type and screen [place]     Status: None (Preliminary result)   Collection Time: 01/29/16  4:43 PM  Result Value Ref Range   ABO/RH(D) O POS    Antibody Screen NEG    Sample Expiration 02/01/2016    Unit Number [id number]    Blood Component  Type RBC LR PHER2    Unit division 00    Status of Unit ALLOCATED    Transfusion Status OK TO TRANSFUSE    Crossmatch Result Compatible    Unit Number [id number]    Blood Component Type RBC LR PHER2    Unit division 00    Status of Unit ALLOCATED    Transfusion Status OK TO TRANSFUSE    Crossmatch Result Compatible   Hemoglobin and hematocrit, blood     Status: Abnormal   Collection Time: 01/29/16  6:23 PM  Result Value Ref Range   Hemoglobin 8.2 (L) 13.0 - 18.0 g/dL   HCT 25.1 (L) 40.0 - 52.0 %  Prepare RBC     Status: None   Collection Time: 01/29/16  6:30 PM  Result Value Ref Range   Order Confirmation ORDER PROCESSED BY BLOOD BANK   Hemoglobin and hematocrit, blood     Status: Abnormal   Collection Time: 01/30/16 12:15 AM  Result Value Ref Range   Hemoglobin 8.0 (L) 13.0 - 18.0 g/dL   HCT 24.9 (L) 40.0 - 52.0 %   Ct Abdomen Pelvis Wo Contrast  Result Date: 01/29/2016 CLINICAL DATA:  Large abdominal mass believed be lymphoma with rectal bleeding and drop in hemoglobin from 10 down to 8. Recent CT-guided left upper quadrant mass for biopsy on 12/22/2015. EXAM: CT ABDOMEN AND PELVIS WITHOUT CONTRAST TECHNIQUE: Multidetector CT imaging of the abdomen and pelvis was performed following the standard protocol without IV contrast. COMPARISON:  01/22/2016 CT, 01/18/2016 CT FINDINGS: Lower chest: Development of moderate right pleural effusion and small left pleural effusion since previous study with adjacent compressive atelectasis. Small pleural fluid loculations along the periphery of the left lung base. Cardiac chambers are normal in size with coronary arteriosclerosis. Aortic atherosclerosis. Hepatobiliary: No focal liver abnormality is seen. Status post cholecystectomy. No biliary dilatation. Pancreas: Peripancreatic mass, at the center and left upper quadrant is again seen, difficult to separate from medial aspect of the spleen, body and tail of the pancreas, left adrenal gland and  gastric body, further limited given lack of IV contrast. This is not changed significantly in size measuring approximately 18 x 19.4 x 14.5 cm.  No hematoma status post biopsy. Areas of necrosis are noted in the upper outer aspect of this mass as well centrally. Spleen: As above Adrenals/Urinary Tract: Left adrenal gland was involved by the left upper quadrant masslike abnormality. Right adrenal gland is unremarkable. There is an exophytic 1.6 cm simple appearing cyst off the lower pole of the right kidney. No obstructive uropathy. Urinary bladder is unremarkable. Stomach/Bowel: There is no bowel obstruction or perforation. There is colonic diverticulosis. Vascular/Lymphatic: Gastrohepatic, retrocrural, celiac and mesenteric lymphadenopathy is again noted without significant interval change, largest is para-aortic/ retroperitoneal in the upper abdomen measuring up to 3.5 cm, series 3 image 40. The patient has undergone endovascular aortic stent grafting of the 6.4 cm in diameter infrarenal abdominal aortic aneurysm with bicommon iliac stents. Reproductive: Partially visualized pelvic penile pump and prosthesis Other:  Small volume ascites along the paracolic gutters. Musculoskeletal: No acute or suspicious osseous lesions. IMPRESSION: New findings since prior exam include moderate right and small left pleural effusions at each lung base with bibasilar atelectasis and infrarenal abdominal aortic aneurysm endovascular stent grafting with stenting of both common iliac arteries. Extensive colonic diverticulosis. Unchanged presumed lymph node mass in the left upper quadrant with extensive gastrohepatic, retrocrural, celiac and mesenteric lymphadenopathy unchanged in appearance. 1.6 cm lower pole right renal cyst. Electronically Signed   By: Ashley Royalty M.D.   On: 01/29/2016 18:38    Assessment:  Brian Hughes is a 75 y.o. male with probable high grade lymphoma who was admitted with a large abdominal mass,  hypercalcemia, leukocytosis, and hypoxia.  LDH was 572 on 01/26/2016.  Abdomen and pelvic CT scan on 01/18/2016 revealed a 20 cm heterogeneously enhancing and partially cystic or necrotic soft tissue mass inseparable from the proximal stomach, spleen, body and tail of the pancreas, and left adrenal gland. Extensive upper abdominal and retrocrural lymphadenopathy. No evidence of liver metastatic disease or biliary obstruction.   There was a 6.4 cm infrarenal abdominal aortic aneurysm with no eevidence of acute rupture. There was extensive upper abdominal and root of the mesentery metastatic nodal disease but no distant metastatic disease identified in the pelvis, skeleton, or lower lungs.  Chest CT on 01/21/2016 revealed small bilateral pleural effusions and large left upper quadrant mass adenopathy as reported on CT abdomen.  There was a subpleural nodule right lower lobe 18 x 14 mm with question additional subpleural nodule posterior left upper lobe 10 mm greatest size.  He is s/p AAA repair on 01/24/2016.  Echo on 01/21/2016 revealed an EF of 65-70%.  Plan: 1.  Oncology:  Patient underwent CT guided biopsy of left upper quadrant mass on 01/22/2016.  Pathology reveals high grade CD20+ B cell lymphoma.  Additional testing ongoing.  Hepatitis B core antibody IgM, hepatitis B surface antigen, hepatitis B core antibody total, and hepatitis C antibody negative.    Patient received Rituxan on 01/26/2016 with significant reaction (rigors, desaturations to 68%) despite premedications  Following his reaction, he received Benadryl 50 mg IV , Solumedrol 125 mg IV, and Demerol 12.5 mg IV and placed on a non-rebreather.  He received a total of 148 ml of planned 270 ml.  Patient returned back to baseline in approximately 48 minutes.  Decision made to hold chemotherapy over the weekend given significant reaction.    Discussed with patient today completion of Rituxan (received 100 mg on 01/26/2016) and initiation of  mini-RCOP.  Discussed plan for Rituxan 100 mg IV today with premedications (Solumedrol 125 mg IV, Benadryl 50 mg IV,  Pepcid 20 mg IV, and Tylenol 650 mg po) to prevent reaction.  Patient tolerated Solumedrol on 01/26/2016 following his reaction and noted taking oral steroids in the past (reaction noted in chart to inhaled steroids).  If goes well, then remainder of Rituxan dose tomorrow.  Discussed plan for chemotherapy (mini-CHOP) this week while hospitalized. Discuss bone marrow for staging (if + and high grade lymphoma will need IT therapy) tomorrow.  He is in agreement.  Patient on allopurinol for tumor lysis.  CBC adequate.  CBC count likely elevated post steroids.  2.  Fluids/electrolyes/nutrition:  Hypercalcemia s/p Zometa, Decadron, and calcitonin.  Calcium has improved from 14.8 to 10.0 to 10.2.  Uric acid 3.6 on allopurinol.  3.  Vascular:  Abdominal aortic aneurysm s/p repair on 01/24/2016.  Patient on prophylactic Lovenox.  Discussed with Dr Lucky Cowboy.  No need for anticoagulation.  Lovenox held for bone marrow procedure in AM.  Will need to be on a baby aspirin.  Discussed plans for port-a-cath.  4:  Disposition:  Patient will likely need a skilled nursing facility at discharge.  Given risk for tumor lysis if high grade lymphoma, would prefer patient stay hospitalized for 1-2 days after chemotherapy.  5.  Code status:  DNR.  Addendum:  Patient received premedications today for Rituxan.  Nursing contacted me regarding large volume bloody bowel movement.  Rituxan held.  Nursing contacted Dr. Margaretmary Eddy.  Abdominal CT scheduled.  GI consulted.  Serial hematocrits.  Plans to initiate treatment and bone marrow postponed until GI stability documented.   Lequita Asal, MD  01/29/2016

## 2016-01-30 NOTE — Consult Note (Signed)
Consultation Note Date: 01/30/2016   Patient Name: Brian Hughes  DOB: [birth year]  MRN: [id number]  Age / Sex: 75 y.o., male  PCP: No Pcp Per Patient Referring Physician: Nicholes Mango, MD  Reason for Consultation: Establishing goals of care  HPI/Patient Profile: 75 y.o. male  with past medical history of hypertension and asthma admitted on 01/18/2016 with abdominal pain. Per friend, patient having weakness and intermittent confusion. He has had 40 lb weight loss in 3 months, decreased appetite, and increased urination. Hypokalemia and hypercalcemia on blood work. CT scan of abdomen/pelvis revealed large 20cm mass extending over the bowel, spleen, and pancreas. Also, 6.4 cm abdominal aortic aneurysm. Patient seen by oncology and radiation oncology. 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 Other lab work pending. On 11/8, he had AAA repair. On 11/10 received chemotherapy with significant reaction of rigors and desaturation-back to baseline within one hour. On 11/13, patient was to receive chemotherapy again but experienced a large, bloody bowel movement after pre-medications given. GI consulted-he has orders to receive blood transfusion if Hgb <8. Most recent Hbg is 7.7. EGD in AM, 11/15. Palliative medicine consultation for goals of care.   Clinical Assessment and Goals of Care: This NP met with Brian Hughes at bedside. He is alert, oriented, answering questions, and following commands. He does complain of 9/10 abdominal pain. Recently given IV medication but later asked RN to give Norco per prn order.  Introduced palliative care. Brian Hughes lives in [place] with his only child, Brian Hughes. His wife is deceased. He tells me that his son, Brian Hughes, is going blind from a hereditary problem and that the last few months, they have been back and forth to doctors appointments for Brian Hughes. They take care and  look out for each other. 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 He could tell something was wrong with him, because he was becoming weaker and unable to volunteer on a daily basis.   He seems to have a good understanding of his current medical state but also confused because 'different doctors keep telling me different things-one minute I can eat, and the next I can't.' Discussed with him that today, chemo and radiation are on hold until the EGD is done in the morning. Briefly discussed palliative radiation with him. He understands this would be for comfort and not cure. He tells me he just wants to get this over with, whether he dies or gets better. At this point, interested in chemo or radiation if recommended. He doesn't want to be in pain.   Brian Hughes is a spiritual individual and believes the [name] will carry him through, whether that be through treatment or to the end of his life. His main concern is having the pain controlled. Answered questions. Provided spiritual and emotional support. He is agreeable with continued support from PMT during hospitalization.   HCPOA-son (Brian Hughes)   SUMMARY  OF RECOMMENDATIONS    DNR/DNI  Symptom management per attending.  Dr. Sherrine Maples saw patient after my initial consult. She is suggesting hospice. I will further discuss this with patient and family tomorrow, 11/15.  Code Status/Advance Care Planning:  DNR   Symptom Management:   Per attending  Additional Recommendations (Limitations, Scope, Preferences):  Full Scope Treatment-besides DNR/DNI  Psycho-social/Spiritual:   Desire for further Chaplaincy support:no  Additional Recommendations: Caregiving  Support/Resources  Prognosis:   Unable to determine  Discharge Planning: To Be Determined      Primary Diagnoses: Present on Admission: . Hypercalcemia   I  have reviewed the medical record, interviewed the patient and family, and examined the patient. The following aspects are pertinent.  Past Medical History:  Diagnosis Date  . Asthma   . Cancer ([place])   . Hypertension    Social History   Social History  . Marital status: Widowed    Spouse name: N/A  . Number of children: N/A  . Years of education: N/A   Social History Main Topics  . Smoking status: Never Smoker  . Smokeless tobacco: Never Used  . Alcohol use No  . Drug use: Unknown  . Sexual activity: Not Asked   Other Topics Concern  . None   Social History Narrative  . None   Family History  Problem Relation Age of Onset  . Jaundice Father    Scheduled Meds: . sodium chloride   Intravenous Once  . acetaminophen  650 mg Oral Once  . allopurinol  300 mg Oral BID  . amLODipine  5 mg Oral Daily  . chlorhexidine  15 mL Mouth Rinse BID  . dexamethasone  4 mg Intravenous Once  . docusate sodium  100 mg Oral BID  . famotidine  20 mg Oral Once  . feeding supplement (ENSURE ENLIVE)  237 mL Oral TID BM  . fentaNYL  12.5 mcg Transdermal Q72H  . lisinopril  10 mg Oral Daily  . mouth rinse  15 mL Mouth Rinse BID  . polyethylene glycol  17 g Oral Daily  . riTUXimab (RITUXAN) IV infusion  100 mg Intravenous Once  . senna  1 tablet Oral BID  . sodium chloride flush  3 mL Intravenous Q12H   Continuous Infusions: . sodium chloride 75 mL/hr at 01/30/16 0451  . sodium chloride    . pantoprozole (PROTONIX) infusion 8 mg/hr (01/30/16 0310)   PRN Meds:.acetaminophen **OR** acetaminophen, albuterol, alum & mag hydroxide-simeth, bisacodyl, fentaNYL (SUBLIMAZE) injection, HYDROcodone-acetaminophen, HYDROmorphone (DILAUDID) injection, lactulose, LORazepam, meperidine (DEMEROL) injection, metoprolol, oxyCODONE **OR** oxyCODONE, promethazine Medications Prior to Admission:  Prior to Admission medications   Medication Sig Start Date End Date Taking? Authorizing Provider    lisinopril-hydrochlorothiazide (PRINZIDE,ZESTORETIC) 10-12.5 MG tablet Take 1 tablet by mouth daily. 11/15/15  Yes Historical Provider, MD  pantoprazole (PROTONIX) 40 MG tablet Take 1 tablet by mouth daily. 11/15/15  Yes Historical Provider, MD  simethicone (GAS-X) 80 MG chewable tablet Chew 1 tablet (80 mg total) by mouth 3 (three) times daily. 12/22/15 12/21/16 Yes Harvest Dark, MD  sucralfate (CARAFATE) 1 g tablet Take 1 tablet by mouth daily. 12/13/15  Yes Historical Provider, MD  VENTOLIN HFA 108 (90 Base) MCG/ACT inhaler Inhale 1-2 puffs into the lungs every 6 (six) hours as needed.  10/15/15  Yes Historical Provider, MD   Allergies  Allergen Reactions  . Bupropion Nausea Only and Nausea And Vomiting  . Flunisolide     Other reaction(s): Other (See Comments) Wheezing  . Lisinopril  Cough  . Mometasone     Other reaction(s): Other (See Comments) Wheezing  . Terazosin     Other reaction(s): Other (See Comments) Fatigue  . Valsartan Cough   Review of Systems  Constitutional: Positive for activity change and appetite change.  Gastrointestinal: Positive for abdominal pain.  Neurological: Positive for weakness.  Psychiatric/Behavioral: Positive for confusion.   Physical Exam  Constitutional: He is cooperative. He appears ill.  Cardiovascular: Regular rhythm and normal heart sounds.   Pulmonary/Chest: Effort normal and breath sounds normal. No tachypnea. No respiratory distress.  Abdominal: Soft. Bowel sounds are normal. He exhibits distension. There is tenderness.  Musculoskeletal: He exhibits edema (generalized).  Neurological: He is alert.  Skin: Skin is warm and dry.  Psychiatric: He has a normal mood and affect. His speech is normal and behavior is normal. Cognition and memory are normal.  Nursing note and vitals reviewed.  Vital Signs: BP (!) 142/57 (BP Location: Left Arm)   Pulse 95   Temp 97.8 F (36.6 C) (Oral)   Resp 20   Ht _0  (1.93 m)   Wt 110.2 kg (243 lb)    SpO2 96%   BMI 29.58 kg/m  Pain Assessment: 0-10 POSS *See Group Information*: 1-Acceptable,Awake and alert Pain Score: 9   SpO2: SpO2: 96 % O2 Device:SpO2: 96 % O2 Flow Rate: .O2 Flow Rate (L/min): 4 L/min  IO: Intake/output summary:   Intake/Output Summary (Last 24 hours) at 01/30/16 1524 Last data filed at 01/30/16 1438  Gross per 24 hour  Intake           5094.5 ml  Output             1725 ml  Net           3369.5 ml    LBM: Last BM Date: 01/29/16 Baseline Weight: Weight: 90.7 kg (200 lb) Most recent weight: Weight: 110.2 kg (243 lb)     Palliative Assessment/Data: PPS 50%   Flowsheet Rows   Flowsheet Row Most Recent Value  Intake Tab  Referral Department  Hospitalist  Unit at Time of Referral  Oncology Unit  Palliative Care Primary Diagnosis  Cancer  Palliative Care Type  New Palliative care  Reason for referral  Clarify Goals of Care  Date of Admission  01/18/16  Date first seen by Palliative Care  01/30/16  Clinical Assessment  Palliative Performance Scale Score  50%  Psychosocial & Spiritual Assessment  Palliative Care Outcomes  Patient/Family meeting held?  Yes  Who was at the meeting?  patient  Palliative Care Outcomes  Clarified goals of care, Provided psychosocial or spiritual support      Time In: 1500 Time Out: 1615 Time Total: 17mn Greater than 50%  of this time was spent counseling and coordinating care related to the above assessment and plan.  Signed by:  MIhor Dow FNP-C Palliative Medicine Team  Phone: [phone]: [phone]  Please contact Palliative Medicine Team phone at [phone] questions and concerns.  For individual provider: See AShea Evans

## 2016-01-30 NOTE — Progress Notes (Signed)
Physical Therapy Treatment Patient Details Name: Brian Hughes MRN: [id number] DOB: 09/23/1940 Today's Date: 01/30/2016    History of Present Illness Pt admitted for complaints of weakness and abdominal pain. Per chart review, pt recently lost 40 lbs in last 3 months.  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  New PT orders received for re-eval post-op.  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 Pt is very poor historian secondary to confusion.  Pt with history of ashtma, HTN, and cancer.    PT Comments    Pt in bed, agrees to session.  Bed mobility with use of rail to right and left without physical assist.  Sitting balance edge of bed 15 minutes with fatigue noted.  Pt leaning on knees to rest and unable to sit upright without support.  Stood x 1 and transferred to recliner at bedside with +2 assist for safety.  On second trial of standing he began to have loose BM.  Pt assisted back to bed where he had a large amount in sitting.  Nursing in to assist with care.  He was able to stand for several minutes for care before fatiguing and needing to lay back down to finish bath.   Follow Up Recommendations  SNF     Equipment Recommendations  Rolling walker with 5" wheels    Recommendations for Other Services       Precautions / Restrictions Precautions Precautions: Fall Restrictions Weight Bearing Restrictions: No    Mobility  Bed Mobility Overal bed mobility: Modified Independent Bed Mobility: Supine to Sit;Sit to Supine     Supine to sit: Min guard Sit to supine: Min guard   General bed mobility comments: uses rail  Transfers Overall transfer level: Needs assistance Equipment used: Rolling walker (2 wheeled) Transfers: Sit to/from Stand Sit to Stand: Mod assist         General transfer comment: x 3  Ambulation/Gait Ambulation/Gait assistance: Min assist;+2 physical assistance (few steps by bed) Ambulation Distance  (Feet): 3 Feet Assistive device: Rolling walker (2 wheeled) Gait Pattern/deviations: Step-to pattern Gait velocity: decreased   General Gait Details: session stopped due to inc large bm.  fatigued after cleaning care   Stairs            Wheelchair Mobility    Modified Rankin (Stroke Patients Only)       Balance Overall balance assessment: Needs assistance Sitting-balance support: Feet supported Sitting balance-Leahy Scale: Fair Sitting balance - Comments: leans forward on knees, fatiugues sitting upright   Standing balance support: Bilateral upper extremity supported Standing balance-Leahy Scale: Fair                      Cognition Arousal/Alertness: Awake/alert Behavior During Therapy: WFL for tasks assessed/performed Overall Cognitive Status: No family/caregiver present to determine baseline cognitive functioning                      Exercises      General Comments        Pertinent Vitals/Pain Pain Assessment: No/denies pain    Home Living                      Prior Function            PT Goals (current goals can now be found in the care plan section)      Frequency    Min 2X/week  PT Plan Current plan remains appropriate    Co-evaluation             End of Session Equipment Utilized During Treatment: Gait belt;Oxygen Activity Tolerance: Patient limited by fatigue Patient left: with call bell/phone within reach;with chair alarm set;in bed;with nursing/sitter in room     Time: 1120-1144 PT Time Calculation (min) (ACUTE ONLY): 24 min  Charges:  $Therapeutic Exercise: 8-22 mins $Therapeutic Activity: 8-22 mins                    G Codes:      [name] 02-18-16, 11:50 AM

## 2016-01-30 NOTE — Progress Notes (Addendum)
[facility] [place] NAME: Brian Hughes    MR#:  [id number]  DATE OF BIRTH:  13-Apr-1940  SUBJECTIVE:  CHIEF COMPLAINT:   Chief Complaint  Patient presents with  . Weakness   - s/p AAA repair 01/24/2016.  -Had infusion reaction 01/27/16 - hypoxia, elevated BP , infusion d/ced half way,  pt is doing Fine today, on 4 L of oxygen via nasal cannula no other episodes of SOB   REVIEW OF SYSTEMS:  Review of Systems  Constitutional: Positive for malaise/fatigue and weight loss. Negative for chills and fever.  HENT: Negative for ear discharge, nosebleeds and tinnitus.   Eyes: Negative for blurred vision.  Respiratory: Negative for cough, shortness of breath and wheezing.   Cardiovascular: Positive for leg swelling. Negative for chest pain and palpitations.  Gastrointestinal: Positive for abdominal pain and constipation. Negative for diarrhea, nausea and vomiting.  Genitourinary: Negative for dysuria and urgency.  Musculoskeletal: Positive for joint pain. Negative for myalgias.  Skin: Negative for rash.  Neurological: Negative for dizziness, sensory change, speech change, focal weakness, seizures and headaches.  Psychiatric/Behavioral: Negative for depression.       Some confusion still present.    DRUG ALLERGIES:   Allergies  Allergen Reactions  . Bupropion Nausea Only and Nausea And Vomiting  . Flunisolide     Other reaction(s): Other (See Comments) Wheezing  . Lisinopril Cough  . Mometasone     Other reaction(s): Other (See Comments) Wheezing  . Terazosin     Other reaction(s): Other (See Comments) Fatigue  . Valsartan Cough    VITALS:  Blood pressure (!) 142/57, pulse 95, temperature 97.8 F (36.6 C), temperature source Oral, resp. rate 20, height _0  (1.93 m), weight 110.2 kg (243 lb), SpO2 96 %.  PHYSICAL EXAMINATION:  Physical Exam  GENERAL:  75 y.o.-year-old patient lying in the bed with no acute distress.  More alert EYES: Pupils equal, round, reactive to light and accommodation. No scleral icterus. Extraocular muscles intact.  HEENT: Head atraumatic, normocephalic. Oropharynx and nasopharynx clear.  NECK:  Supple, no jugular venous distention. No thyroid enlargement, no tenderness.  LUNGS: Moderate breath sounds bilaterally, no wheezing, rales,rhonchi or crepitation. No use of accessory muscles of respiration. Diminished at the bases CARDIOVASCULAR: S1, S2 normal. No murmurs, rubs, or gallops.  ABDOMEN: Soft, tenderness and full in LUQ, abdomen appears distended. Hypoactive Bowel sounds present.  EXTREMITIES: No pedal edema, cyanosis, or clubbing.  NEUROLOGIC: Cranial nerves II through XII are intact. Muscle strength 5/5 in all extremities. Sensation intact. Gait not checked. Global weakness noted. PSYCHIATRIC: The patient is alert and oriented x 2-3. Intermittent confusion present. SKIN: No obvious rash, lesion, or ulcer.    LABORATORY PANEL:   CBC  Recent Labs Lab 01/30/16 0434 01/30/16 1230  WBC 23.9*  --   HGB 8.8* 7.7*  HCT 27.0* 24.0*  PLT 352  --    ------------------------------------------------------------------------------------------------------------------  Chemistries   Recent Labs Lab 01/24/16 0412  01/30/16 0434  NA 134*  < > 136  K 4.2  < > 4.3  CL 96*  < > 100*  CO2 29  < > 28  GLUCOSE 96  < > 137*  BUN 15  < > 30*  CREATININE 0.70  < > 0.65  CALCIUM 10.5*  < > 10.1  MG 1.7  --   --   AST  --   < > 26  ALT  --   < > 10*  ALKPHOS  --   < > 60  BILITOT  --   < > 0.4  < > = values in this interval not displayed. ------------------------------------------------------------------------------------------------------------------  Cardiac Enzymes No results for input(s): TROPONINI in the last 168 hours. ------------------------------------------------------------------------------------------------------------------  RADIOLOGY:  Ct Abdomen Pelvis Wo  Contrast  Result Date: 01/29/2016 CLINICAL DATA:  Large abdominal mass believed be lymphoma with rectal bleeding and drop in hemoglobin from 10 down to 8. Recent CT-guided left upper quadrant mass for biopsy on 12/22/2015. EXAM: CT ABDOMEN AND PELVIS WITHOUT CONTRAST TECHNIQUE: Multidetector CT imaging of the abdomen and pelvis was performed following the standard protocol without IV contrast. COMPARISON:  01/22/2016 CT, 01/18/2016 CT FINDINGS: Lower chest: Development of moderate right pleural effusion and small left pleural effusion since previous study with adjacent compressive atelectasis. Small pleural fluid loculations along the periphery of the left lung base. Cardiac chambers are normal in size with coronary arteriosclerosis. Aortic atherosclerosis. Hepatobiliary: No focal liver abnormality is seen. Status post cholecystectomy. No biliary dilatation. Pancreas: Peripancreatic mass, at the center and left upper quadrant is again seen, difficult to separate from medial aspect of the spleen, body and tail of the pancreas, left adrenal gland and gastric body, further limited given lack of IV contrast. This is not changed significantly in size measuring approximately 18 x 19.4 x 14.5 cm. No hematoma status post biopsy. Areas of necrosis are noted in the upper outer aspect of this mass as well centrally. Spleen: As above Adrenals/Urinary Tract: Left adrenal gland was involved by the left upper quadrant masslike abnormality. Right adrenal gland is unremarkable. There is an exophytic 1.6 cm simple appearing cyst off the lower pole of the right kidney. No obstructive uropathy. Urinary bladder is unremarkable. Stomach/Bowel: There is no bowel obstruction or perforation. There is colonic diverticulosis. Vascular/Lymphatic: Gastrohepatic, retrocrural, celiac and mesenteric lymphadenopathy is again noted without significant interval change, largest is para-aortic/ retroperitoneal in the upper abdomen measuring up to  3.5 cm, series 3 image 40. The patient has undergone endovascular aortic stent grafting of the 6.4 cm in diameter infrarenal abdominal aortic aneurysm with bicommon iliac stents. Reproductive: Partially visualized pelvic penile pump and prosthesis Other:  Small volume ascites along the paracolic gutters. Musculoskeletal: No acute or suspicious osseous lesions. IMPRESSION: New findings since prior exam include moderate right and small left pleural effusions at each lung base with bibasilar atelectasis and infrarenal abdominal aortic aneurysm endovascular stent grafting with stenting of both common iliac arteries. Extensive colonic diverticulosis. Unchanged presumed lymph node mass in the left upper quadrant with extensive gastrohepatic, retrocrural, celiac and mesenteric lymphadenopathy unchanged in appearance. 1.6 cm lower pole right renal cyst. Electronically Signed   By: Ashley Royalty M.D.   On: 01/29/2016 18:38    EKG:   Orders placed or performed during the hospital encounter of 01/18/16  . ED EKG  . ED EKG  . EKG 12-Lead  . EKG 12-Lead    ASSESSMENT AND PLAN:   75 year old male with past medical history significant for asthma, hypertension presents to the hospital secondary to weight loss, abdominal pain and constipation going on for almost 4 months now.   # GI bleed-melena Held chemo, lovenox, bone marrow biopsy NPO, PPI gtt CT abd and pelvis -lymph node mass in the left upper quadrant and colonic diverticulosis Bleeding scan if pt bleeds again  H n h and transfuse prn , hb 10.1 - 8.4-8.8-7.7-transfuse 2 unit of blood Appreciate GI recommendations EGD tomorrow a.m.   # Diffuse B-cell lymphoma -  s/p infusion reaction -20 cm x 20 cm abdominal mass of unknown primary. B-cell Lymphoma-CT-guided biopsy -started chemo 11/10 - had infusion reaction, s/p benadryl, solumedrol,demerol and NRB- better now,o on 4 L of oxygen via nasal cannula  --Reinitiation of Rituxan 11/13 planned but patient  had a GI bleed-chemotherapy held  - LDH is elevated, as is uric acid. -Radiation oncology has planned palliative radiation therapy, appreciate Dr. Donette Larry recommendation - on allopurinol for tumor lysis-uric acid at 1.9    #2 Hypercalcemia-hypercalcemia is secondary to underlying malignancy. -Receiving IV fluids. Also on calcitonin- discontinue calcitonin now -Zometa given 01/19/2016. Received 1 dose of decadron over the weekend. -monitor. Improving calcium and stable at 10.5.-10.2   #3 Abdominal distention and abdominal pain-  secondary to abdominal mass,   #4 metabolic encephalopathy- li from underlying infection and hypercalcemia- Resolved Improving, no focal deficits - CT head on admission normal   #5 AAA- infrarenal AAA of about 6.5 cm noted with no rupture but has has mural plaque Vascular surgery consulted- cardiac clearance appreciated- please see their note. -, ECHO ordered. Showing LVH, EF is 28%, mild diastolic dysfunction noted. -  S/P REPAIR ENDOVASCULAR DONE ON 01/24/16  #6 DVT prophylaxis-scd  from GI bleed  #7 HTN- started lisinopril  SNF at discharge   Poor prognosis, palliative care consult placed All the records are reviewed and case discussed with Care Management/Social Workerr. Management plans discussed with the patient, son Brian Hughes  over [phone]  and they are in agreement.  CODE STATUS: DNR     TOTAL TIME TAKING CARE OF THIS PATIENT: 45 minutes. Nicholes Mango M.D on 01/30/2016 at 2:57 PM  Between 7am to 6pm - Pager - [phone]  After 6pm go to [email or web address] - password [email or web address] [facility]  Office  [phone]  CC: Primary care physician; No PCP Per Patient

## 2016-01-31 ENCOUNTER — Ambulatory Visit: Payer: Medicare Other

## 2016-01-31 ENCOUNTER — Inpatient Hospital Stay: Payer: Medicare Other | Admitting: Anesthesiology

## 2016-01-31 ENCOUNTER — Encounter
Admission: EM | Disposition: A | Discharge status: Hospice | Payer: Self-pay | Source: Home / Self Care | Attending: Internal Medicine

## 2016-01-31 DIAGNOSIS — Z7189 Other specified counseling: Secondary | ICD-10-CM

## 2016-01-31 DIAGNOSIS — Z515 Encounter for palliative care: Secondary | ICD-10-CM

## 2016-01-31 DIAGNOSIS — R1084 Generalized abdominal pain: Secondary | ICD-10-CM

## 2016-01-31 HISTORY — PX: ESOPHAGOGASTRODUODENOSCOPY (EGD) WITH PROPOFOL: SHX5813

## 2016-01-31 LAB — CBC WITH DIFFERENTIAL/PLATELET
BAND NEUTROPHILS: 0 %
BASOS ABS: 0 10*3/uL (ref 0–0.1)
BASOS PCT: 0 %
BLASTS: 0 %
EOS ABS: 0 10*3/uL (ref 0–0.7)
Eosinophils Relative: 0 %
HEMATOCRIT: 31.5 % — AB (ref 40.0–52.0)
HEMOGLOBIN: 10.2 g/dL — AB (ref 13.0–18.0)
LYMPHS PCT: 1 %
Lymphs Abs: 0.3 10*3/uL — ABNORMAL LOW (ref 1.0–3.6)
MCH: 27.4 pg (ref 26.0–34.0)
MCHC: 32.3 g/dL (ref 32.0–36.0)
MCV: 84.8 fL (ref 80.0–100.0)
MONO ABS: 2.5 10*3/uL — AB (ref 0.2–1.0)
MYELOCYTES: 0 %
Metamyelocytes Relative: 0 %
Monocytes Relative: 9 %
NEUTROS PCT: 90 %
Neutro Abs: 25 10*3/uL — ABNORMAL HIGH (ref 1.4–6.5)
Other: 0 %
PROMYELOCYTES ABS: 0 %
Platelets: 411 10*3/uL (ref 150–440)
RBC: 3.72 MIL/uL — ABNORMAL LOW (ref 4.40–5.90)
RDW: 15.7 % — ABNORMAL HIGH (ref 11.5–14.5)
WBC: 27.8 10*3/uL — ABNORMAL HIGH (ref 3.8–10.6)
nRBC: 0 /100 WBC

## 2016-01-31 LAB — COMPREHENSIVE METABOLIC PANEL
ALK PHOS: 70 U/L (ref 38–126)
ALT: 12 U/L — AB (ref 17–63)
ANION GAP: 8 (ref 5–15)
AST: 27 U/L (ref 15–41)
Albumin: 1.9 g/dL — ABNORMAL LOW (ref 3.5–5.0)
BILIRUBIN TOTAL: 1 mg/dL (ref 0.3–1.2)
BUN: 21 mg/dL — ABNORMAL HIGH (ref 6–20)
CALCIUM: 10.1 mg/dL (ref 8.9–10.3)
CO2: 28 mmol/L (ref 22–32)
CREATININE: 0.72 mg/dL (ref 0.61–1.24)
Chloride: 101 mmol/L (ref 101–111)
GFR calc non Af Amer: >60 mL/min (ref >60)
GLUCOSE: 90 mg/dL (ref 65–99)
Potassium: 4.2 mmol/L (ref 3.5–5.1)
SODIUM: 137 mmol/L (ref 135–145)
TOTAL PROTEIN: 5.8 g/dL — AB (ref 6.5–8.1)

## 2016-01-31 LAB — HEMOGLOBIN AND HEMATOCRIT, BLOOD
HEMATOCRIT: 26.4 % — AB (ref 40.0–52.0)
HEMATOCRIT: 30.1 % — AB (ref 40.0–52.0)
HEMOGLOBIN: 8.8 g/dL — AB (ref 13.0–18.0)
Hemoglobin: 9.8 g/dL — ABNORMAL LOW (ref 13.0–18.0)

## 2016-01-31 LAB — URIC ACID: Uric Acid, Serum: 3.4 mg/dL — ABNORMAL LOW (ref 4.4–7.6)

## 2016-01-31 LAB — LACTATE DEHYDROGENASE: LDH: 401 U/L — ABNORMAL HIGH (ref 98–192)

## 2016-01-31 SURGERY — ESOPHAGOGASTRODUODENOSCOPY (EGD) WITH PROPOFOL
Anesthesia: General

## 2016-01-31 MED ORDER — OXYCODONE HCL 5 MG/5ML PO SOLN
5.0000 mg | ORAL | Status: DC | PRN
  Administered 2016-01-31 – 2016-02-01 (×4): 5 mg via ORAL
  Filled 2016-01-31 (×4): qty 5

## 2016-01-31 MED ORDER — MIDAZOLAM HCL 2 MG/2ML IJ SOLN
INTRAMUSCULAR | Status: DC | PRN
  Administered 2016-01-31: 1 mg via INTRAVENOUS

## 2016-01-31 MED ORDER — PROPOFOL 10 MG/ML IV BOLUS
INTRAVENOUS | Status: DC | PRN
  Administered 2016-01-31: 40 mg via INTRAVENOUS
  Administered 2016-01-31 (×2): 20 mg via INTRAVENOUS
  Administered 2016-01-31: 50 mg via INTRAVENOUS
  Administered 2016-01-31 (×2): 30 mg via INTRAVENOUS

## 2016-01-31 MED ORDER — LIDOCAINE HCL (CARDIAC) 20 MG/ML IV SOLN
INTRAVENOUS | Status: DC | PRN
  Administered 2016-01-31: 60 mg via INTRAVENOUS

## 2016-01-31 MED ORDER — HYDROMORPHONE HCL 1 MG/ML IJ SOLN: 2.0000 mg | Freq: Once | INTRAMUSCULAR | Status: DC

## 2016-01-31 MED ORDER — LORAZEPAM 2 MG/ML IJ SOLN
2.0000 mg | INTRAMUSCULAR | Status: DC | PRN
  Administered 2016-02-01 – 2016-02-02 (×3): 2 mg via INTRAMUSCULAR
  Filled 2016-01-31 (×3): qty 1

## 2016-01-31 MED ORDER — FENTANYL 50 MCG/HR TD PT72
50.0000 ug | MEDICATED_PATCH | TRANSDERMAL | Status: DC
  Administered 2016-01-31: 22:00:00 50 ug via TRANSDERMAL
  Filled 2016-01-31: qty 1

## 2016-01-31 MED ORDER — ACETAMINOPHEN 160 MG/5ML PO SOLN
500.0000 mg | ORAL | Status: DC | PRN
  Filled 2016-01-31: qty 20

## 2016-01-31 MED ORDER — HYDROMORPHONE HCL 2 MG PO TABS
2.0000 mg | ORAL_TABLET | ORAL | Status: DC | PRN
  Administered 2016-01-31 – 2016-02-02 (×6): 2 mg via ORAL
  Filled 2016-01-31 (×6): qty 1

## 2016-01-31 MED ORDER — LORAZEPAM 2 MG PO TABS
2.0000 mg | ORAL_TABLET | ORAL | Status: DC | PRN
Start: 2016-01-31 — End: 2016-02-02
  Administered 2016-01-31 – 2016-02-02 (×7): 2 mg via ORAL
  Filled 2016-01-31 (×7): qty 1

## 2016-01-31 MED ORDER — SODIUM CHLORIDE 0.9 % IJ SOLN
PREFILLED_SYRINGE | INTRAMUSCULAR | Status: DC | PRN
  Administered 2016-01-31: 4 mL

## 2016-01-31 MED ORDER — FENTANYL CITRATE (PF) 100 MCG/2ML IJ SOLN
INTRAMUSCULAR | Status: DC | PRN
  Administered 2016-01-31 (×2): 50 ug via INTRAVENOUS

## 2016-01-31 MED ORDER — PROPOFOL 500 MG/50ML IV EMUL
INTRAVENOUS | Status: DC | PRN
  Administered 2016-01-31: 50 ug/kg/min via INTRAVENOUS

## 2016-01-31 MED ORDER — SODIUM CHLORIDE 0.9 % IV SOLN
INTRAVENOUS | Status: DC | PRN
  Administered 2016-01-31 (×2): via INTRAVENOUS

## 2016-01-31 MED ORDER — LORAZEPAM 1 MG PO TABS
1.0000 mg | ORAL_TABLET | ORAL | Status: DC | PRN
  Administered 2016-01-31: 1 mg via ORAL
  Filled 2016-01-31: qty 1

## 2016-01-31 MED ORDER — FENTANYL 50 MCG/HR TD PT72: 50.0000 ug | MEDICATED_PATCH | TRANSDERMAL | Status: DC

## 2016-01-31 MED ORDER — LORAZEPAM 2 MG/ML IJ SOLN: 1.0000 mg | Freq: Once | INTRAMUSCULAR | Status: DC

## 2016-01-31 MED ORDER — HYDROMORPHONE HCL 2 MG/ML IJ SOLN: 2.0000 mg | Freq: Once | INTRAMUSCULAR | Status: DC

## 2016-01-31 NOTE — Plan of Care (Signed)
Problem: Pain Managment: Goal: General experience of comfort will improve Outcome: Progressing Provided with Comfort Measures. Dilaudid IV given this shift. Improvement noted. Dilaudid oral given this shift. Improvement noted. Oxycodone 5 mg/ml given this shift. Improvement noted. Ativan given this shift. Improvement noted.

## 2016-01-31 NOTE — Progress Notes (Addendum)
EGD:  Active bleeding seen close to GE junction in the stomach , could not localize the area, epinephrine injected and clips placed in the area.   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  Dr Jonathon Bellows  Gastroenterology/Hepatology Pager: [phone]

## 2016-01-31 NOTE — Care Management (Signed)
NPO. Scheduled for EGD today 01/31/16. Hgb 8.8 Oxygen 4 liters per nasal cannula. Fentanyl for pain. Palliative Care referral in progress.  Shelbie Ammons RN MSN CCM Care Management [phone]

## 2016-01-31 NOTE — Care Management Important Message (Signed)
Important Message  Patient Details  Name: Brian Hughes MRN: [id number] Date of Birth: [birth year]   Medicare Important Message Given:  Yes    Shelbie Ammons, RN 01/31/2016, 10:04 AM

## 2016-01-31 NOTE — Op Note (Signed)
[facility] Patient Name: Brian Hughes Procedure Date: 01/31/2016 11:27 AM MRN: [id number] Account #: [id number] Date of Birth: Apr 04, 1940 Admit Type: Outpatient Age: 75 Room: [facility] ENDO ROOM 1 Gender: Male Note Status: Finalized Procedure:            Upper GI endoscopy Indications:          Melena Providers:            Jonathon Bellows MD, MD Referring MD:         No Local Md, MD (Referring MD) Medicines:            Monitored Anesthesia Care Complications:        No immediate complications. Procedure:            Pre-Anesthesia Assessment:                       - Prior to the procedure, a History and Physical was                        performed, and patient medications, allergies and                        sensitivities were reviewed. The patient's tolerance of                        previous anesthesia was reviewed.                       - The risks and benefits of the procedure and the                        sedation options and risks were discussed with the                        patient. All questions were answered and informed                        consent was obtained.                       - ASA Grade Assessment: IV - A patient with severe                        systemic disease that is a constant threat to life.                       After obtaining informed consent, the endoscope was                        passed under direct vision. Throughout the procedure,                        the patient's blood pressure, pulse, and oxygen                        saturations were monitored continuously. The Endoscope                        was introduced through the mouth, and advanced to the  third part of duodenum. The upper GI endoscopy was                        performed with moderate difficulty due to excessive                        bleeding. Successful completion of the procedure was                        aided by  performing the maneuvers documented (below) in                        this report. The patient tolerated the procedure well. Findings:      The examined duodenum was normal.      The esophagus was normal.      On retrofelxion , noted active bright red bleeding, pulsataline in       nature. Washed the area, could not localize the bleeding spot. I       injected epinephrine 1:10,000 1 cc in 4 qudrants close to the area of       bleeding. After a few minutes the bleeding stopped. There was a large       clot which was extracted using a roth net. The underlying mucosa       appeared abnormal , erthematous , edematous and pale in color likely       from the epihephrine. No active spot of bleeding could be seen, no       visible vessel. The entire area was pulsating. I placed 1 clip in close       proximity on normal appearing mucosa and attempted to place a second       clip over the abnormal appearing mucosa which sheared off and there was       some minimal bleeding . A third clip was placed over the same spot which       help. This was 2 cm distal to the GE junction. . Impression:           - Normal examined duodenum.                       - Normal esophagus.                       - No specimens collected. Recommendation:       - NPO for 2 days.                       - Give Protonix (pantoprazole): 8 mg/hr IV by                        continuous infusion for 3 days.                       - I would suggest CT angiogram to embolize any vessel                        that is actively bleeding . At the end of the procedure                        there was some ozzing from the site where I placed  clips. Unsure underlying etiology ?Dieulefoy lesion vs                        malignancy associated abnormal mucosa. He would require                        a repeat EGD in 24-48 hours to visualize the area .                       - Return patient to hospital ward for ongoing  care. Procedure Code(s):    --- Professional ---                       [phone], Esophagogastroduodenoscopy, flexible, transoral;                        diagnostic, including collection of specimen(s) by                        brushing or washing, when performed (separate procedure) Diagnosis Code(s):    --- Professional ---                       K92.1, Melena (includes Hematochezia) CPT copyright 2016 American Medical Association. All rights reserved. The codes documented in this report are preliminary and upon coder review may  be revised to meet current compliance requirements. Jonathon Bellows, MD Jonathon Bellows MD, MD 01/31/2016 12:19:38 PM This report has been signed electronically. Number of Addenda: 0 Note Initiated On: 01/31/2016 11:27 AM Estimated Blood Loss: 300 cc of blood and water used for irrigation in the                        suction [facility]

## 2016-01-31 NOTE — Progress Notes (Signed)
[facility] [place] NAME: Brian Hughes    MR#:  [id number]  DATE OF BIRTH:  Jul 25, 1940  SUBJECTIVE:  CHIEF COMPLAINT:   Chief Complaint  Patient presents with  . Weakness   - s/p AAA repair 01/24/2016.  -Had infusion reaction 01/27/16 - hypoxia, elevated BP , infusion d/ced half way, Patient had GI bleed prior to second time infusion, second chemotherapy was canceled  pt is complaining of abdominal pain 9 out of 10 prior to EGD but agreeable for EGD  REVIEW OF SYSTEMS:  Review of Systems  Constitutional: Positive for malaise/fatigue and weight loss. Negative for chills and fever.  HENT: Negative for ear discharge, nosebleeds and tinnitus.   Eyes: Negative for blurred vision.  Respiratory: Negative for cough, shortness of breath and wheezing.   Cardiovascular: Positive for leg swelling. Negative for chest pain and palpitations.  Gastrointestinal: Positive for abdominal pain and constipation. Negative for diarrhea, nausea and vomiting.  Genitourinary: Negative for dysuria and urgency.  Musculoskeletal: Positive for joint pain. Negative for myalgias.  Skin: Negative for rash.  Neurological: Negative for dizziness, sensory change, speech change, focal weakness, seizures and headaches.  Psychiatric/Behavioral: Negative for depression.       Some confusion still present.    DRUG ALLERGIES:   Allergies  Allergen Reactions  . Bupropion Nausea Only and Nausea And Vomiting  . Flunisolide     Other reaction(s): Other (See Comments) Wheezing  . Lisinopril Cough  . Mometasone     Other reaction(s): Other (See Comments) Wheezing  . Terazosin     Other reaction(s): Other (See Comments) Fatigue  . Valsartan Cough    VITALS:  Blood pressure (!) 149/65, pulse (!) 103, temperature 98.3 F (36.8 C), resp. rate (!) 24, height _0  (1.93 m), weight 108.4 kg (239 lb), SpO2 96 %.  PHYSICAL EXAMINATION:  Physical Exam  GENERAL:  75  y.o.-year-old patient lying in the bed with no acute distress. More alert EYES: Pupils equal, round, reactive to light and accommodation. No scleral icterus. Extraocular muscles intact.  HEENT: Head atraumatic, normocephalic. Oropharynx and nasopharynx clear.  NECK:  Supple, no jugular venous distention. No thyroid enlargement, no tenderness.  LUNGS: Moderate breath sounds bilaterally, no wheezing, rales,rhonchi or crepitation. No use of accessory muscles of respiration. Diminished at the bases CARDIOVASCULAR: S1, S2 normal. No murmurs, rubs, or gallops.  ABDOMEN: Soft, tenderness and full in LUQ, abdomen appears distended. Hypoactive Bowel sounds present.  EXTREMITIES: No pedal edema, cyanosis, or clubbing.  NEUROLOGIC: Cranial nerves II through XII are intact. Muscle strength 5/5 in all extremities. Sensation intact. Gait not checked. Global weakness noted. PSYCHIATRIC: The patient is alert and oriented x 2-3. Intermittent confusion present. SKIN: No obvious rash, lesion, or ulcer.    LABORATORY PANEL:   CBC  Recent Labs Lab 01/31/16 0724  WBC 27.8*  HGB 10.2*  HCT 31.5*  PLT 411   ------------------------------------------------------------------------------------------------------------------  Chemistries   Recent Labs Lab 01/31/16 0724  NA 137  K 4.2  CL 101  CO2 28  GLUCOSE 90  BUN 21*  CREATININE 0.72  CALCIUM 10.1  AST 27  ALT 12*  ALKPHOS 70  BILITOT 1.0   ------------------------------------------------------------------------------------------------------------------  Cardiac Enzymes No results for input(s): TROPONINI in the last 168 hours. ------------------------------------------------------------------------------------------------------------------  RADIOLOGY:  Ct Abdomen Pelvis Wo Contrast  Result Date: 01/29/2016 CLINICAL DATA:  Large abdominal mass believed be lymphoma with rectal bleeding and drop in hemoglobin from 10 down to 8.  Recent  CT-guided left upper quadrant mass for biopsy on 12/22/2015. EXAM: CT ABDOMEN AND PELVIS WITHOUT CONTRAST TECHNIQUE: Multidetector CT imaging of the abdomen and pelvis was performed following the standard protocol without IV contrast. COMPARISON:  01/22/2016 CT, 01/18/2016 CT FINDINGS: Lower chest: Development of moderate right pleural effusion and small left pleural effusion since previous study with adjacent compressive atelectasis. Small pleural fluid loculations along the periphery of the left lung base. Cardiac chambers are normal in size with coronary arteriosclerosis. Aortic atherosclerosis. Hepatobiliary: No focal liver abnormality is seen. Status post cholecystectomy. No biliary dilatation. Pancreas: Peripancreatic mass, at the center and left upper quadrant is again seen, difficult to separate from medial aspect of the spleen, body and tail of the pancreas, left adrenal gland and gastric body, further limited given lack of IV contrast. This is not changed significantly in size measuring approximately 18 x 19.4 x 14.5 cm. No hematoma status post biopsy. Areas of necrosis are noted in the upper outer aspect of this mass as well centrally. Spleen: As above Adrenals/Urinary Tract: Left adrenal gland was involved by the left upper quadrant masslike abnormality. Right adrenal gland is unremarkable. There is an exophytic 1.6 cm simple appearing cyst off the lower pole of the right kidney. No obstructive uropathy. Urinary bladder is unremarkable. Stomach/Bowel: There is no bowel obstruction or perforation. There is colonic diverticulosis. Vascular/Lymphatic: Gastrohepatic, retrocrural, celiac and mesenteric lymphadenopathy is again noted without significant interval change, largest is para-aortic/ retroperitoneal in the upper abdomen measuring up to 3.5 cm, series 3 image 40. The patient has undergone endovascular aortic stent grafting of the 6.4 cm in diameter infrarenal abdominal aortic aneurysm with bicommon  iliac stents. Reproductive: Partially visualized pelvic penile pump and prosthesis Other:  Small volume ascites along the paracolic gutters. Musculoskeletal: No acute or suspicious osseous lesions. IMPRESSION: New findings since prior exam include moderate right and small left pleural effusions at each lung base with bibasilar atelectasis and infrarenal abdominal aortic aneurysm endovascular stent grafting with stenting of both common iliac arteries. Extensive colonic diverticulosis. Unchanged presumed lymph node mass in the left upper quadrant with extensive gastrohepatic, retrocrural, celiac and mesenteric lymphadenopathy unchanged in appearance. 1.6 cm lower pole right renal cyst. Electronically Signed   By: Ashley Royalty M.D.   On: 01/29/2016 18:38    EKG:   Orders placed or performed during the hospital encounter of 01/18/16  . ED EKG  . ED EKG  . EKG 12-Lead  . EKG 12-Lead    ASSESSMENT AND PLAN:   75 year old male with past medical history significant for asthma, hypertension presents to the hospital secondary to weight loss, abdominal pain and constipation going on for almost 4 months now.   # GI bleed-melena Held chemo, lovenox, bone marrow biopsy NPO, PPI gtt Had EGD today,According to her GI physician Dr. Bailey Mech patient has active bleeding close to GE junction in the stomach, could not localize the area, epinephrine was injected and keeps placed and recommended stat CT angiogram of the abdomen and continue PPI IV for 72 hours Stat CT angiogram of the abdomen and pelvis is pending at this time CT abd and pelvis 01/29/2016 -lymph node mass in the left upper quadrant and colonic diverticulosis Bleeding scan is ordered stat as the patient is actively bleeding  H n h and transfuse prn , hb 10.1 - 8.4-8.8-7.7-transfused 2 unit of blood 01/30/16 hb 10.2 Appreciate GI recommendations     # Diffuse B-cell lymphoma -s/p infusion reaction -20 cm x 20 cm  abdominal mass of unknown primary.  B-cell Lymphoma-CT-guided biopsy -started chemo 11/10 - had infusion reaction, s/p benadryl, solumedrol,demerol and NRB- better now,o on 4 L of oxygen via nasal cannula  --Reinitiation of Rituxan 11/13 planned but patient had a GI bleed-chemotherapy held  - LDH is elevated, as is uric acid. -Radiation oncology has planned palliative radiation therapy today appreciate Dr. Donette Larry recommendation - on allopurinol for tumor lysis-uric acid at 1.9    #2 Hypercalcemia-hypercalcemia is secondary to underlying malignancy. -Receiving IV fluids. Also on calcitonin- discontinue calcitonin now -Zometa given 01/19/2016. Received 1 dose of decadron over the weekend. -monitor. Improving calcium and stable at 10.5.-10.2   #3 Abdominal distention and abdominal pain-  secondary to abdominal mass,   #4 metabolic encephalopathy- li from underlying infection and hypercalcemia- Resolved Improving, no focal deficits - CT head on admission normal   #5 AAA- infrarenal AAA of about 6.5 cm noted with no rupture but has has mural plaque Vascular surgery consulted- cardiac clearance appreciated- please see their note. -, ECHO ordered. Showing LVH, EF is 87%, mild diastolic dysfunction noted. -  S/P REPAIR ENDOVASCULAR DONE ON 01/24/16  #6 DVT prophylaxis-scd  from GI bleed  #7 HTN- started lisinopril  SNF at discharge   Poor prognosis, palliative care consult appreciated  All the records are reviewed and case discussed with Care Management/Social Workerr.   Management plans discussed with the patient's son MARCELIS WISSNER  over phone [phone]  , his current diagnosis and critical condition was discussed in detail. He is aware that patient is actively bleeding and in terrible pain with poor prognosis and agreeable with comfort care measures and hydromorphone. He is legally blind and will come to see his dad tomorrow a.m. and meat palliative care regarding hospice care options at that time. Palliative care PA  this Jinny Blossom is present during the conference call with the son  CODE STATUS: DNR, no morphine but ok with dilaudid     TOTAL CRITICAL CARE TIME TAKING CARE OF THIS PATIENT: 45 minutes. Nicholes Mango M.D on 01/31/2016 at 2:15 PM  Between 7am to 6pm - Pager - [phone]  After 6pm go to [email or web address] - password [email or web address] [place] [facility]  Office  [phone]  CC: Primary care physician; No PCP Per Patient

## 2016-01-31 NOTE — H&P (Signed)
Jonathon Bellows MD [address] [place], [place] [address] Phone: [phone] Fax : [phone]  Primary Care Physician:  No PCP Per Patient Primary Gastroenterologist:  Dr. Jonathon Bellows   Pre-Procedure History & Physical: HPI:  Brian Hughes is a 75 y.o. male is here for an endoscopy.   Past Medical History:  Diagnosis Date  . Anemia   . Asthma   . Cancer ([place])     B CELL LYMPHOMA  . Effect of chemotherapy   . Hypercalcemia   . Hypertension   . Malnutrition ([place])   . Metabolic encephalopathy   . Radiation adverse effect     Past Surgical History:  Procedure Laterality Date  . ABDOMINAL AORTIC ANEURYSM REPAIR    . PERIPHERAL VASCULAR CATHETERIZATION N/A 01/24/2016   Procedure: Endovascular Repair/Stent Graft;  Surgeon: Algernon Huxley, MD;  Location: [facility];  Service: Cardiovascular;  Laterality: N/A;    Prior to Admission medications   Medication Sig Start Date End Date Taking? Authorizing Provider  lisinopril-hydrochlorothiazide (PRINZIDE,ZESTORETIC) 10-12.5 MG tablet Take 1 tablet by mouth daily. 11/15/15  Yes Historical Provider, MD  pantoprazole (PROTONIX) 40 MG tablet Take 1 tablet by mouth daily. 11/15/15  Yes Historical Provider, MD  simethicone (GAS-X) 80 MG chewable tablet Chew 1 tablet (80 mg total) by mouth 3 (three) times daily. 12/22/15 12/21/16 Yes Harvest Dark, MD  sucralfate (CARAFATE) 1 g tablet Take 1 tablet by mouth daily. 12/13/15  Yes Historical Provider, MD  VENTOLIN HFA 108 (90 Base) MCG/ACT inhaler Inhale 1-2 puffs into the lungs every 6 (six) hours as needed.  10/15/15  Yes Historical Provider, MD    Allergies as of 01/18/2016 - Review Complete 01/18/2016  Allergen Reaction Noted  . Bupropion Nausea Only and Nausea And Vomiting 03/01/2015  . Flunisolide  03/01/2015  . Lisinopril Cough 03/01/2015  . Mometasone  03/01/2015  . Terazosin  03/01/2015  . Valsartan Cough 03/01/2015    Family History  Problem Relation Age of  Onset  . Jaundice Father     Social History   Social History  . Marital status: Widowed    Spouse name: N/A  . Number of children: N/A  . Years of education: N/A   Occupational History  . Not on file.   Social History Main Topics  . Smoking status: Never Smoker  . Smokeless tobacco: Never Used  . Alcohol use No  . Drug use: No  . Sexual activity: Not on file   Other Topics Concern  . Not on file   Social History Narrative  . No narrative on file    Review of Systems: See HPI, otherwise negative ROS  Physical Exam: BP (!) 159/68   Pulse (!) 108   Temp 98.4 F (36.9 C) (Oral)   Resp (!) 22   Ht 6\' 4"  (1.93 m)   Wt 239 lb (108.4 kg)   SpO2 94%   BMI 29.09 kg/m  General:   Alert,  pleasant and cooperative in NAD Head:  Normocephalic and atraumatic. Neck:  Supple; no masses or thyromegaly. Lungs:  Clear throughout to auscultation.    Heart:  Regular rate and rhythm. Abdomen:  Soft, nontender and nondistended. Normal bowel sounds, without guarding, and without rebound.   Neurologic:  Alert and  oriented x4;  grossly normal neurologically.  Impression/Plan: Brian Hughes is here for an endoscopy to be performed for abdominal pain on going for over 5 months worse the last few days.   Risks, benefits, limitations, and  alternatives regarding  endoscopy have been reviewed with the patient.  Questions have been answered.  All parties agreeable.   Jonathon Bellows, MD  01/31/2016, 11:23 AM

## 2016-01-31 NOTE — Transfer of Care (Signed)
Immediate Anesthesia Transfer of Care Note  Patient: Brian Hughes  Procedure(s) Performed: Procedure(s): ESOPHAGOGASTRODUODENOSCOPY (EGD) WITH PROPOFOL (N/A)  Patient Location: PACU  Anesthesia Type:General  Level of Consciousness: awake, alert  and oriented  Airway & Oxygen Therapy: Patient Spontanous Breathing and Patient connected to nasal cannula oxygen  Post-op Assessment: Report given to RN and Post -op Vital signs reviewed and stable  Post vital signs: Reviewed and stable  Last Vitals:  Vitals:   01/31/16 1042 01/31/16 1216  BP: (!) 159/68   Pulse: (!) 108   Resp: (!) 22   Temp: 36.9 C 36.1 C    Last Pain:  Vitals:   01/31/16 1216  TempSrc: Tympanic  PainSc:       Patients Stated Pain Goal: 0 ([id number])  Complications: No apparent anesthesia complications

## 2016-01-31 NOTE — Anesthesia Postprocedure Evaluation (Signed)
Anesthesia Post Note  Patient: Brian Hughes  Procedure(s) Performed: Procedure(s) (LRB): ESOPHAGOGASTRODUODENOSCOPY (EGD) WITH PROPOFOL (N/A)  Patient location during evaluation: PACU Anesthesia Type: General Level of consciousness: awake and alert and oriented Pain management: pain level controlled Vital Signs Assessment: post-procedure vital signs reviewed and stable Respiratory status: spontaneous breathing Cardiovascular status: blood pressure returned to baseline Anesthetic complications: no    Last Vitals:  Vitals:   01/31/16 1042 01/31/16 1216  BP: (!) 159/68   Pulse: (!) 108   Resp: (!) 22   Temp: 36.9 C 36.1 C    Last Pain:  Vitals:   01/31/16 1216  TempSrc: Tympanic  PainSc:                  [name]

## 2016-01-31 NOTE — Progress Notes (Signed)
Pt moaning in pain but falls asleep easily.  Pt getting in and out of bed asking to sit up but then wants to lay back down.  Lungs sound clear, diminished.  O2Sat 95-97% on 4 L .  HR tachycardic at 110 but drops to 90's when pt lays down.  Gave fentanyl IV 25 mcg to help with pain.  Pt npo for EGD today. Dorna Bloom RN

## 2016-01-31 NOTE — OR Nursing (Signed)
Pt to pacu since 1215. Very confused and agitated. Trying to get oob . Ripping off 3 o2 finger probes and bp cuffs continously. Dr Vicente Males would like pt to go to CT SCAN. PT TOO AGITATED.PT ALSO TOO AGITATED AND BUE EDEMATOUS TO ATTEMPT IV  FOR CT SCAN. RN GAVE REPORT TO KRISTA RN ON FLOOR AND PRIMARY RN REPORTS SEND PT BACK TO FLOOR  NOW  AND PRIMARY MD WILL BE CALLED. ORDERLY CAME TO TRANSPORT PT BACK TO FLOOR

## 2016-01-31 NOTE — Plan of Care (Signed)
Problem: Nutritional: Goal: Maintenance of adequate nutrition will improve Outcome: Not Applicable Date Met: 88/26/66 NPO

## 2016-01-31 NOTE — Progress Notes (Signed)
Daily Progress Note   Patient Name: Brian Hughes       Date: 02/01/2016 DOB: January 26, 1941  Age: 75 y.o. MRN#: [id number] Attending Physician: Nicholes Mango, MD Primary Care Physician: No PCP Per Patient Admit Date: 01/18/2016  Reason for Consultation/Follow-up: Establishing goals of care  Subjective: Met with patient after EGD. Alert, oriented, but intermittently confused and restless. Wanting to sit in the chair. Discussed again in detail patients understanding of medical condition and prognosis. He understands he has terminal cancer and chemotherapy/radiation would be for palliation and not cure. Discussed hospice options with patient. He is agreeable with hospice and would like to go home and be with his son and dog. Discussed my concern with his safety and continued decline at home. His son is legally blind and unable to provide adequate support. Patient wants comfort, quality, dignity. He understands comfort measures means no escalation of care including further testing for the active bleeding, chemotherapy, or continued hospitalization. I feel patient needs more education with his son at bedside due to intermittent episodes of confusion.  Spoke with son, [name] via telephone. He is unable to drive himself to the hospital due to blindness but will be at the hospital tomorrow for an appointment. Discussed severity of medical condition, including terminal cancer and active bleeding. Briefly discussed hospice and [name] agrees with hospice services but would like to further discuss hospice home with his father in person in AM. [name] would like his father to be kept comfortable over night, with no escalation of care, until he see him in the morning.   Length of Stay: 14  Current Medications: Scheduled  Meds:  . fentaNYL  50 mcg Transdermal Q72H  .  HYDROmorphone (DILAUDID) injection  2 mg Intravenous Once  . riTUXimab (RITUXAN) IV infusion  100 mg Intravenous Once    Continuous Infusions:   PRN Meds: acetaminophen (TYLENOL) oral liquid 160 mg/5 mL, albuterol, HYDROcodone-acetaminophen, HYDROmorphone, LORazepam **OR** LORazepam, LORazepam, oxyCODONE  Physical Exam  Constitutional: He appears ill.  Cardiovascular: Regular rhythm and normal heart sounds.   Pulmonary/Chest: He has decreased breath sounds.  Dyspnea with exertion.   Abdominal: Soft. Bowel sounds are normal. He exhibits distension. There is tenderness.  Musculoskeletal: He exhibits edema (generalized).  Neurological: He is alert.  Intermittent confusion  Skin: Skin is warm and dry.  Psychiatric:  His speech is normal. His mood appears anxious.  restless  Nursing note and vitals reviewed.          Vital Signs: BP (!) 129/45 (BP Location: Left Arm)   Pulse 100   Temp 98.3 F (36.8 C) (Oral)   Resp 18   Ht _0  (1.93 m)   Wt 108.4 kg (239 lb)   SpO2 97%   BMI 29.09 kg/m  SpO2: SpO2: 97 % O2 Device: O2 Device: Nasal Cannula O2 Flow Rate: O2 Flow Rate (L/min): 4 L/min  Intake/output summary:   Intake/Output Summary (Last 24 hours) at 02/01/16 0810 Last data filed at 02/01/16 0500  Gross per 24 hour  Intake              540 ml  Output              900 ml  Net             -360 ml   LBM: Last BM Date: 01/31/16 Baseline Weight: Weight: 90.7 kg (200 lb) Most recent weight: Weight: 108.4 kg (239 lb)   Palliative Assessment/Data: 50%   Flowsheet Rows   Flowsheet Row Most Recent Value  Intake Tab  Referral Department  Hospitalist  Unit at Time of Referral  Oncology Unit  Palliative Care Primary Diagnosis  Cancer  Date Notified  01/30/16  Palliative Care Type  New Palliative care  Reason for referral  Clarify Goals of Care  Date of Admission  01/18/16  Date first seen by Palliative Care  01/30/16  #  of days Palliative referral response time  0 Day(s)  # of days IP prior to Palliative referral  12  Clinical Assessment  Palliative Performance Scale Score  50%  Psychosocial & Spiritual Assessment  Palliative Care Outcomes  Patient/Family meeting held?  Yes  Who was at the meeting?  patient and son via telephone  Palliative Care Outcomes  Provided psychosocial or spiritual support, Clarified goals of care, Changed to focus on comfort, Improved pain interventions, Counseled regarding hospice      Patient Active Problem List   Diagnosis Date Noted  . Palliative care encounter   . Goals of care, counseling/discussion   . Generalized abdominal pain   . Hematochezia   . Protein-calorie malnutrition, severe 01/19/2016  . Hypercalcemia 01/18/2016    Palliative Care Assessment & Plan   Patient Profile: 75 y.o. male  with past medical history of hypertension and asthma admitted on 01/18/2016 with abdominal pain. Per friend, patient having weakness and intermittent confusion. He has had 40 lb weight loss in 3 months, decreased appetite, and increased urination. Hypokalemia and hypercalcemia on blood work. CT scan of abdomen/pelvis revealed large 20cm mass extending over the bowel, spleen, and pancreas. Also, 6.4 cm abdominal aortic aneurysm. Patient seen by oncology and radiation oncology. 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 Other lab work pending. On 11/8, he had AAA repair. On 11/10 received chemotherapy with significant reaction of rigors and desaturation-back to baseline within one hour. On 11/13, patient was to receive chemotherapy again but experienced a large, bloody bowel movement after pre-medications given. GI consulted-he has orders to receive blood transfusion if Hgb <8. Most recent Hbg is 7.7. EGD performed-patient is actively bleeding, ? dieulefoy lesion. GI recommending CT angio and repeat EGD when stable. Palliative medicine consultation for goals of  care.   Assessment: GI bleed Abdominal distention secondary to abdominal mass Abdominal pain Diffuse B-cell lymphoma Hypercalcemia Metabolic  encephalopathy AAA  Recommendations/Plan:  Comfort measures only. No escalation of care.  Symptom management per attending.  PMT will f/u in AM with son at bedside to further discuss comfort measures only and hospice.   Code Status: DNR   Code Status Orders        Start     Ordered   01/29/16 1312  Do not attempt resuscitation (DNR)  Continuous    Question Answer Comment  In the event of cardiac or respiratory ARREST Do not call a "code blue"   In the event of cardiac or respiratory ARREST Do not perform Intubation, CPR, defibrillation or ACLS   In the event of cardiac or respiratory ARREST Use medication by any route, position, wound care, and other measures to relive pain and suffering. May use oxygen, suction and manual treatment of airway obstruction as needed for comfort.   Comments RN may pronounce, do not give morphine      01/29/16 1312    Code Status History    Date Active Date Inactive Code Status Order ID Comments User Context   01/18/2016  9:15 PM 01/29/2016  1:12 PM DNR [id number]  Hillary Bow, MD ED    Advance Directive Documentation   Flowsheet Row Most Recent Value  Type of Advance Directive  Out of facility DNR (pink MOST or yellow form)  Pre-existing out of facility DNR order (yellow form or pink MOST form)  No data  "MOST" Form in Place?  No data       Prognosis:   Unable to determine guarded-new diagnosis diffuse B-cell lymphoma with active GI bleeding.  Discharge Planning:  To Be Determined   Care plan was discussed with patient, son ([name]), RN, care management, and Dr. Margaretmary Eddy  Thank you for allowing the Palliative Medicine Team to assist in the care of this patient.   Time In: 1330 Time Out: 1530 Total Time 123mn Prolonged Time Billed  yes       Greater than 50%  of this time was spent  counseling and coordinating care related to the above assessment and plan.  MIhor Dow FNP-C Palliative Medicine Team  Phone: [phone]: [phone] Please contact Palliative Medicine Team phone at [phone] questions and concerns.

## 2016-01-31 NOTE — Progress Notes (Signed)
On call MD, Dr. Posey Pronto, notified multiple unsuccessful IV attempts this afternoon and patient c/o pain and very anxious. No medications can be given at this time. MD to place new orders.

## 2016-01-31 NOTE — Anesthesia Preprocedure Evaluation (Signed)
Anesthesia Evaluation  Patient identified by MRN, date of birth, ID band Patient awake    Reviewed: Allergy & Precautions, NPO status , Patient's Chart, lab work & pertinent test results  History of Anesthesia Complications Negative for: history of anesthetic complications  Airway Mallampati: II  TM Distance: >3 FB Neck ROM: Full    Dental  (+) Edentulous Upper, Edentulous Lower   Pulmonary asthma , neg sleep apnea, neg COPD,    breath sounds clear to auscultation- rhonchi (-) wheezing      Cardiovascular hypertension, Pt. on medications + Peripheral Vascular Disease  (-) CAD and (-) Past MI  Rhythm:Regular Rate:Normal - Systolic murmurs and - Diastolic murmurs Echo Q000111Q: - Left ventricle: The cavity size was normal. Wall thickness was   increased in a pattern of mild LVH. Systolic function was   vigorous. The estimated ejection fraction was in the range of 65%   to 70%. Doppler parameters are consistent with abnormal left   ventricular relaxation (grade 1 diastolic dysfunction). - Aortic valve: Mildly calcified annulus. Trileaflet; mildly   thickened leaflets. Valve area (VTI): 3.54 cm^2. Valve area   (Vmax): 3 cm^2. - Mitral valve: Mildly to moderately calcified annulus. Mildly   thickened leaflets . - Atrial septum: No defect or patent foramen ovale was identified. - Technically adequate study.   Neuro/Psych negative neurological ROS  negative psych ROS   GI/Hepatic negative GI ROS, Neg liver ROS, Obstructing gastric mass with associated abdominal pain    Endo/Other  negative endocrine ROSneg diabetes  Renal/GU negative Renal ROS     Musculoskeletal negative musculoskeletal ROS (+)   Abdominal (+) - obese,   Peds  Hematology negative hematology ROS (+) anemia ,   Anesthesia Other Findings Past Medical History: No date: Anemia No date: Asthma No date: Cancer ([place])     Comment:  B CELL LYMPHOMA No  date: Effect of chemotherapy No date: Hypercalcemia No date: Hypertension No date: Malnutrition ([place]) No date: Metabolic encephalopathy No date: Radiation adverse effect  Reproductive/Obstetrics                             Anesthesia Physical  Anesthesia Plan  ASA: III  Anesthesia Plan: General   Post-op Pain Management:    Induction: Intravenous  Airway Management Planned: Nasal Cannula  Additional Equipment: Arterial line  Intra-op Plan: Utilization Of Total Body Hypothermia per surgeon request  Post-operative Plan: Extubation in OR  Informed Consent: I have reviewed the patients History and Physical, chart, labs and discussed the procedure including the risks, benefits and alternatives for the proposed anesthesia with the patient or authorized representative who has indicated his/her understanding and acceptance.   Dental advisory given  Plan Discussed with: CRNA and Surgeon  Anesthesia Plan Comments: (Discussed with patient that we will plan to suspend DNR order during the perioperative period)        Anesthesia Quick Evaluation

## 2016-01-31 NOTE — Progress Notes (Signed)
Pt unable to get comfortable.  In and out of bed. States he wants to sit up in chair. Had pt sit up in recliner for 1 hour but then pt became too restless and unable to keep pt in chair.  Taking gown off, kicking covers off.  Unable to reorient.  Pt groaning and crying.  Fentanyl patch applied to right arm, dilaudid given 30 minutes after ativan and Roxicodone to try and achieve pain relief. Staff in room every few minutes for safety.  Bed alarm on. Dorna Bloom RN

## 2016-02-01 ENCOUNTER — Ambulatory Visit: Payer: Medicare Other

## 2016-02-01 ENCOUNTER — Encounter: Payer: Self-pay | Admitting: Gastroenterology

## 2016-02-01 DIAGNOSIS — Z515 Encounter for palliative care: Secondary | ICD-10-CM

## 2016-02-01 DIAGNOSIS — Z7189 Other specified counseling: Secondary | ICD-10-CM

## 2016-02-01 LAB — TYPE AND SCREEN
ABO/RH(D): O POS
Antibody Screen: NEGATIVE
UNIT DIVISION: 0
UNIT DIVISION: 0
Unit division: 0

## 2016-02-01 MED ORDER — HALOPERIDOL LACTATE 5 MG/ML IJ SOLN
2.0000 mg | INTRAMUSCULAR | Status: DC | PRN
  Administered 2016-02-01: 15:00:00 2 mg via INTRAMUSCULAR
  Filled 2016-02-01: qty 1

## 2016-02-01 MED ORDER — HALOPERIDOL 2 MG PO TABS: 2.0000 mg | ORAL_TABLET | ORAL | Status: DC | PRN

## 2016-02-01 MED ORDER — OXYCODONE HCL 5 MG/5ML PO SOLN: 10.0000 mg | ORAL | Status: DC | PRN

## 2016-02-01 MED ORDER — OXYCODONE HCL 5 MG/5ML PO SOLN
10.0000 mg | ORAL | Status: DC | PRN
  Administered 2016-02-01 – 2016-02-02 (×3): 10 mg via ORAL
  Administered 2016-02-02: 11:00:00 5 mg via ORAL
  Filled 2016-02-01 (×4): qty 10

## 2016-02-01 NOTE — Plan of Care (Signed)
Problem: Safety: Goal: Ability to remain free from injury will improve Outcome: Progressing Telesitter implemented.

## 2016-02-01 NOTE — Progress Notes (Signed)
PT Cancellation Note  Patient Details Name: Deniro Qadir MRN: [id number] DOB: 1941-02-22   Cancelled Treatment:    Reason Eval/Treat Not Completed: Medical issues which prohibited therapy   Pt not appropriate for therapy at this time.  Palliative discussing comfort care/hospice with family.  Will continue as appropriate.   [name] 02/01/2016, 12:25 PM

## 2016-02-01 NOTE — Progress Notes (Signed)
Nutrition Brief Note  Brian Hughes  is a 75 y.o. male with a known history of Asthma, hypertension presents to the emergency room complaining of many months of vague abdominal pain. He has had worsening weakness some on and off confusion as per his friend at bedside. Found to have lymphoma.  -Patient s/p repair endovascular on 11/8 for AAA. -Patient started chemo 11/10 but had infusion reaction.  Chart reviewed and spoke with RN. Patient now transitioning to comfort care only. There will be a follow-up meeting today to discuss discharging with hospice. Spoke with Dr. Ara Kussmaul about possibly allowing comfort feeding if patient requests (no diet order at this time), but there is concern for possible aspiration in setting of lethargy/confusion.  No further nutrition interventions warranted at this time. Please re-consult as needed.  Brian Blade, MS, RD, LDN Pager: [phone] After Hours Pager: [phone]

## 2016-02-01 NOTE — Progress Notes (Signed)
Daily Progress Note   Patient Name: Brian Hughes       Date: 02/01/2016 DOB: [birth year]  Age: 75 y.o. MRN#: [id number] Attending Physician: Harvie Bridge, DO Primary Care Physician: No PCP Per Patient Admit Date: 01/18/2016  Reason for Consultation/Follow-up: Establishing goals of care and Hospice Evaluation  Subjective: Patient more drowsy and confused today. Intermittent episodes of pain/agitation. PRN medications given. Patient not answering questions, just moaning when awake.   Met with son at bedside. He attempted to talk with his father about going to a residential hospice facility but Mr. Luty not answering his questions. Discussed with [name] in detail current medical condition, prognosis, and disposition. Discussed in detail comfort measures, no escalation of care, and hospice facility. Discussed my concern that even if patient would want to pursue further interventions for GI bleeding, that this would overall not change his poor prognosis due to terminal cancer and these interventions would be more harm than good to the patient. [name] does not want to see his dad suffer. He wants him to be kept comfortable. He understands that time is short. Discussed EOL expectations and symptom management. He absolutely does not want his father to have Morphine per his wishes on his living will. Answered all questions. Provided emotional and spiritual support.   Length of Stay: 14  Current Medications: Scheduled Meds:  . fentaNYL  50 mcg Transdermal Q72H  .  HYDROmorphone (DILAUDID) injection  2 mg Intravenous Once  . riTUXimab (RITUXAN) IV infusion  100 mg Intravenous Once   Continuous Infusions:  PRN Meds: acetaminophen (TYLENOL) oral liquid 160 mg/5 mL, albuterol, haloperidol **OR**  haloperidol lactate, HYDROcodone-acetaminophen, HYDROmorphone, LORazepam **OR** LORazepam, LORazepam, oxyCODONE  Physical Exam  Constitutional: He is easily aroused. He appears ill.  Cardiovascular: Regular rhythm and normal heart sounds.   Pulmonary/Chest: He has decreased breath sounds.  Dyspnea with exertion.   Abdominal: Soft. Bowel sounds are normal. He exhibits distension. There is tenderness.  Musculoskeletal: He exhibits edema (generalized).  Neurological: He is easily aroused. He is disoriented.  Intermittent confusion  Skin: Skin is warm and dry.  Psychiatric: His speech is delayed. Cognition and memory are impaired.  restless He is inattentive.  Nursing note and vitals reviewed.          Vital Signs: BP (!) 143/81 (BP Location: Left Arm)  Pulse (!) 119   Temp 98 F (36.7 C) (Oral)   Resp 18   Ht 6' 4"  (1.93 m)   Wt 108.4 kg (239 lb)   SpO2 94%   BMI 29.09 kg/m  SpO2: SpO2: 94 % O2 Device: O2 Device: Nasal Cannula O2 Flow Rate: O2 Flow Rate (L/min): 4 L/min  Intake/output summary:   Intake/Output Summary (Last 24 hours) at 02/01/16 2106 Last data filed at 02/01/16 1248  Gross per 24 hour  Intake              240 ml  Output             1050 ml  Net             -810 ml   LBM: Last BM Date: 01/31/16 Baseline Weight: Weight: 90.7 kg (200 lb) Most recent weight: Weight: 108.4 kg (239 lb)   Palliative Assessment/Data: 20%   Flowsheet Rows   Flowsheet Row Most Recent Value  Intake Tab  Referral Department  Hospitalist  Unit at Time of Referral  Oncology Unit  Palliative Care Primary Diagnosis  Cancer  Date Notified  01/30/16  Palliative Care Type  New Palliative care  Reason for referral  Clarify Goals of Care  Date of Admission  01/18/16  Date first seen by Palliative Care  01/30/16  # of days Palliative referral response time  0 Day(s)  # of days IP prior to Palliative referral  12  Clinical Assessment  Palliative Performance Scale Score  20%    Psychosocial & Spiritual Assessment  Palliative Care Outcomes  Patient/Family meeting held?  Yes  Who was at the meeting?  son  Palliative Care Outcomes  Clarified goals of care, Counseled regarding hospice, Changed to focus on comfort, Provided psychosocial or spiritual support, Provided end of life care assistance, Improved pain interventions      Patient Active Problem List   Diagnosis Date Noted  . Encounter for hospice care discussion   . Palliative care encounter   . Goals of care, counseling/discussion   . Generalized abdominal pain   . Hematochezia   . Protein-calorie malnutrition, severe 01/19/2016  . Hypercalcemia 01/18/2016    Palliative Care Assessment & Plan   Patient Profile: 75 y.o. male  with past medical history of hypertension and asthma admitted on 01/18/2016 with abdominal pain. Per friend, patient having weakness and intermittent confusion. He has had 40 lb weight loss in 3 months, decreased appetite, and increased urination. Hypokalemia and hypercalcemia on blood work. CT scan of abdomen/pelvis revealed large 20cm mass extending over the bowel, spleen, and pancreas. Also, 6.4 cm abdominal aortic aneurysm. Patient seen by oncology and radiation oncology. 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 Other lab work pending. On 11/8, he had AAA repair. On 11/10 received chemotherapy with significant reaction of rigors and desaturation-back to baseline within one hour. On 11/13, patient was to receive chemotherapy again but experienced a large, bloody bowel movement after pre-medications given. GI consulted-he has orders to receive blood transfusion if Hgb <8. Most recent Hbg is 7.7. EGD performed-patient is actively bleeding, ? dieulefoy lesion. GI recommending CT angio and repeat EGD when stable. Palliative medicine consultation for goals of care.   Assessment: GI bleed Abdominal distention secondary to abdominal mass Abdominal pain Diffuse B-cell  lymphoma Hypercalcemia Metabolic encephalopathy AAA  Recommendations/Plan:  Comfort measures only. No escalation of care.  Son agrees with residential hospice. Social work consulted.   May give  Haldol 80m PO or IM q4h prn agitation.  Oxycodone 198mPO q1h prn pain/dyspnea. Son does not want any Morphine given per his father's living will.   Code Status: DNR   Code Status Orders        Start     Ordered   01/29/16 1312  Do not attempt resuscitation (DNR)  Continuous    Question Answer Comment  In the event of cardiac or respiratory ARREST Do not call a "code blue"   In the event of cardiac or respiratory ARREST Do not perform Intubation, CPR, defibrillation or ACLS   In the event of cardiac or respiratory ARREST Use medication by any route, position, wound care, and other measures to relive pain and suffering. May use oxygen, suction and manual treatment of airway obstruction as needed for comfort.   Comments RN may pronounce, do not give morphine      01/29/16 1312    Code Status History    Date Active Date Inactive Code Status Order ID Comments User Context   01/18/2016  9:15 PM 01/29/2016  1:12 PM DNR [id number] BowMD ED    Advance Directive Documentation   Flowsheet Row Most Recent Value  Type of Advance Directive  Out of facility DNR (pink MOST or yellow form)  Pre-existing out of facility DNR order (yellow form or pink MOST form)  No data  "MOST" Form in Place?  No data       Prognosis:   < 2 weeks-new diagnosis diffuse B-cell lymphoma with active GI bleeding. Life prolonging interventions have been discontinued.   Discharge Planning:  Hospice facility - frequent symptom management and poor prognosis  Care plan was discussed with patient, son ([name]), RN, care management, and Dr. HuAra KussmaulThank you for allowing the Palliative Medicine Team to assist in the care of this patient.   Time In: 1630 Time Out: 1730 Total Time 6041mProlonged Time  Billed  no      Greater than 50%  of this time was spent counseling and coordinating care related to the above assessment and plan.  MegIhor DowNP-C Palliative Medicine Team  Phone: [phone]: [phone]: [phone] contact Palliative Medicine Team phone at [phone] questions and concerns.

## 2016-02-01 NOTE — Clinical Social Work Note (Signed)
MSW received consult that patient may need hospice home placement.  According to Palliative, patient's son is going to speak with patient to discuss if he wants to go to a hospice facility or receive hospice at home.  MSW to continue to follow patient's progress and my need hospice home depending on discussion with son and palliative medicine.  Jones Broom. Norval Morton, MSW [phone]  Mon-Fri 8a-4:30p 02/01/2016 5:41 PM

## 2016-02-02 MED ORDER — LORAZEPAM 2 MG/ML IJ SOLN: 2.0000 mg | INTRAMUSCULAR | 0 refills | Status: AC | PRN

## 2016-02-02 MED ORDER — LORAZEPAM 1 MG PO TABS: 1.0000 mg | ORAL_TABLET | ORAL | 0 refills | Status: AC | PRN

## 2016-02-02 MED ORDER — ACETAMINOPHEN 160 MG/5ML PO SOLN: 500.0000 mg | ORAL | 0 refills | Status: AC | PRN

## 2016-02-02 MED ORDER — HYDROMORPHONE HCL 1 MG/ML IJ SOLN: 2.0000 mg | Freq: Once | INTRAMUSCULAR | 0 refills | Status: AC

## 2016-02-02 MED ORDER — HYDROMORPHONE HCL 2 MG PO TABS: 2.0000 mg | ORAL_TABLET | ORAL | 0 refills | Status: AC | PRN

## 2016-02-02 MED ORDER — HYDROCODONE-ACETAMINOPHEN 5-325 MG PO TABS: 1.0000 | ORAL_TABLET | ORAL | 0 refills | Status: AC | PRN

## 2016-02-02 MED ORDER — FENTANYL 50 MCG/HR TD PT72: 50.0000 ug | MEDICATED_PATCH | TRANSDERMAL | 0 refills | Status: AC

## 2016-02-02 MED ORDER — HALOPERIDOL 2 MG PO TABS: 2.0000 mg | ORAL_TABLET | ORAL | 0 refills | Status: AC | PRN

## 2016-02-02 MED ORDER — LORAZEPAM 2 MG PO TABS: 2.0000 mg | ORAL_TABLET | ORAL | 0 refills | Status: AC | PRN

## 2016-02-02 MED ORDER — OXYCODONE HCL 5 MG/5ML PO SOLN: 10.0000 mg | ORAL | 0 refills | Status: AC | PRN

## 2016-02-02 MED ORDER — ALBUTEROL SULFATE (2.5 MG/3ML) 0.083% IN NEBU: 2.5000 mg | INHALATION_SOLUTION | RESPIRATORY_TRACT | 12 refills | Status: AC | PRN

## 2016-02-02 NOTE — Discharge Summary (Signed)
[name]    MR#:  [id number]  DATE OF BIRTH:  01-16-1941  DATE OF ADMISSION:  01/18/2016   ADMITTING PHYSICIAN: Hillary Bow, MD  DATE OF DISCHARGE: 02/02/16  PRIMARY CARE PHYSICIAN: No PCP Per Patient   ADMISSION DIAGNOSIS:  Hypercalcemia [E83.52] Hypokalemia [E87.6] Hypoxia [R09.02] Generalized abdominal mass [R19.07] Abdominal aortic aneurysm (AAA) without rupture (HCC) [I71.4] Aspiration pneumonia of left lung, unspecified aspiration pneumonia type, unspecified part of lung ([place]) [J69.0] DISCHARGE DIAGNOSIS:  Active Problems:   Hypercalcemia   Protein-calorie malnutrition, severe   Hematochezia   Palliative care encounter   Goals of care, counseling/discussion   Generalized abdominal pain   Encounter for hospice care discussion   Comfort measures only status  SECONDARY DIAGNOSIS:   Past Medical History:  Diagnosis Date  . Anemia   . Asthma   . Cancer ([place])     B CELL LYMPHOMA  . Effect of chemotherapy   . Hypercalcemia   . Hypertension   . Malnutrition ([place])   . Metabolic encephalopathy   . Radiation adverse effect    HOSPITAL COURSE:   Patient is a 75 year old male with history of asthma hypertension who presented to the emergency department complaining of abdominal pain weakness and confusion. He was found to have a large mass over the bowel spleen and pancreas suspicious for lymphoma. Imaging was suspicious for pneumonia and received IV Zosyn and vancomycin. Electrolyte abnormalities included hypokalemia and hypercalcemia.  He was admitted for hypercalcemia and treated where IV fluids, Lasix. Oncology was consulted for the abdominal mass and he was treated for COPD after pneumonia was ruled out by CT.  He was seen in consultation by vascular surgery who identified infrarenal abdominal aortic aneurysm and a vascular patch repair recommended.  Oncology was consulted and  recommended percutaneous biopsy. Mental status continued to decline prompting transfer to the intensive care unit. He began receiving calcitonin Zometa and steroids.  Cardiology was consulted for preop evaluation for planned endovascular repair of AAA the abdomen. Echo revealed EF 60% with mild diastolic dysfunction. Patient underwent endovascular repair of AAA on 01/24/2016.  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 The dose was split and given with allopurinol for tumor lysis prophylaxis. Radiation oncology also was consulted and recommended systemic chemotherapy. Patient started Rituxan on 01/26/2016 with subsequent reaction for which she was treated with IV Medrol Benadryl and Demerol with improvement of symptoms.  GI was physical and consult on 01/29/2016 with new onset of rectal bleeding. CTA of abdomen and pelvis to assess AAA what repair was recommended by vascular surgery.  Findings included bilateral pleural effusions unchanged lymph node mass in the left upper quadrant.  He was made nothing by mouth and started on Protonix drip and was transfused. Plan for EGD which he underwent on 11:15 with Dr. Vicente Males. Active bleeding was seen at the GE junction which could not be localized and that CT angiogram and repeat EGD was recommended A short course of palliative radiation therapy was suggested by radiation oncology.  Palliative care was consulted on 11/15 and met with the son on 11/16 after which the patient was placed on comfort measures only with a plan for residential hospice. Comfort medications were initiated. Patient was accepted to [facility] facility. Today in the day of discharge he remains obtunded, intermittently opens his eyes and moans. He is not able to answer  any questions or provide any history.  DISCHARGE CONDITIONS:  GI bleed Diffuse B-cell lymphoma Chemotherapy Infusion reaction Hypercalcemia of  malignancy Metabolic encephalopathy Abdominal aortic aneurysm status post repair Hypertension  CONSULTS OBTAINED:  Treatment Team:  Creola Corn, MD Sela Hua, PA-C Noreene Filbert, MD Lucilla Lame, MD  Palliative care DRUG ALLERGIES:   Allergies  Allergen Reactions  . Bupropion Nausea Only and Nausea And Vomiting  . Flunisolide     Other reaction(s): Other (See Comments) Wheezing  . Lisinopril Cough  . Mometasone     Other reaction(s): Other (See Comments) Wheezing  . Terazosin     Other reaction(s): Other (See Comments) Fatigue  . Valsartan Cough   DISCHARGE MEDICATIONS:     Medication List    STOP taking these medications   lisinopril-hydrochlorothiazide 10-12.5 MG tablet Commonly known as:  PRINZIDE,ZESTORETIC   pantoprazole 40 MG tablet Commonly known as:  PROTONIX     TAKE these medications   acetaminophen 160 MG/5ML solution Commonly known as:  TYLENOL Take 15.6 mLs (500 mg total) by mouth every 4 (four) hours as needed for mild pain, headache or fever.   fentaNYL 50 MCG/HR Commonly known as:  DURAGESIC - dosed mcg/hr Place 1 patch (50 mcg total) onto the skin every 3 (three) days. Start taking on:  02/03/2016   haloperidol 2 MG tablet Commonly known as:  HALDOL Take 1 tablet (2 mg total) by mouth every 4 (four) hours as needed for agitation.   HYDROcodone-acetaminophen 5-325 MG tablet Commonly known as:  NORCO/VICODIN Take 1-2 tablets by mouth every 4 (four) hours as needed for moderate pain.   HYDROmorphone 2 MG tablet Commonly known as:  DILAUDID Take 1 tablet (2 mg total) by mouth every 3 (three) hours as needed for severe pain.   HYDROmorphone 1 MG/ML injection Commonly known as:  DILAUDID Inject 2 mLs (2 mg total) into the vein once.   LORazepam 1 MG tablet Commonly known as:  ATIVAN Take 1 tablet (1 mg total) by mouth every 4 (four) hours as needed for anxiety.   LORazepam 2 MG tablet Commonly known as:   ATIVAN Take 1 tablet (2 mg total) by mouth every 2 (two) hours as needed for anxiety or sedation.   LORazepam 2 MG/ML injection Commonly known as:  ATIVAN Inject 1 mL (2 mg total) into the muscle every 2 (two) hours as needed for anxiety or sedation.   oxyCODONE 5 MG/5ML solution Commonly known as:  ROXICODONE Take 10 mLs (10 mg total) by mouth every hour as needed for moderate pain or severe pain (dyspnea).   simethicone 80 MG chewable tablet Commonly known as:  GAS-X Chew 1 tablet (80 mg total) by mouth 3 (three) times daily.   sucralfate 1 g tablet Commonly known as:  CARAFATE Take 1 tablet by mouth daily.   VENTOLIN HFA 108 (90 Base) MCG/ACT inhaler Generic drug:  albuterol Inhale 1-2 puffs into the lungs every 6 (six) hours as needed. What changed:  Another medication with the same name was added. Make sure you understand how and when to take each.   albuterol (2.5 MG/3ML) 0.083% nebulizer solution Commonly known as:  PROVENTIL Take 3 mLs (2.5 mg total) by nebulization every 2 (two) hours as needed for wheezing or shortness of breath. What changed:  You were already taking a medication with the same name, and this prescription was added. Make sure you understand how and when to take each.        DISCHARGE  INSTRUCTIONS:  Hospice facility DIET:  Regular diet DISCHARGE CONDITION:  Critical ACTIVITY:  Activity as tolerated OXYGEN:  Home Oxygen: No.  Oxygen Delivery: room air DISCHARGE LOCATION:  Hospice   If you experience worsening of your admission symptoms, develop shortness of breath, life threatening emergency, suicidal or homicidal thoughts you must seek medical attention immediately by calling 911 or calling your MD immediately  if symptoms less severe.  You Must read complete instructions/literature along with all the possible adverse reactions/side effects for all the Medicines you take and that have been prescribed to you. Take any new Medicines after you  have completely understood and accpet all the possible adverse reactions/side effects.   Please note  You were cared for by a hospitalist during your hospital stay. If you have any questions about your discharge medications or the care you received while you were in the hospital after you are discharged, you can call the unit and asked to speak with the hospitalist on call if the hospitalist that took care of you is not available. Once you are discharged, your primary care physician will handle any further medical issues. Please note that NO REFILLS for any discharge medications will be authorized once you are discharged, as it is imperative that you return to your primary care physician (or establish a relationship with a primary care physician if you do not have one) for your aftercare needs so that they can reassess your need for medications and monitor your lab values.    On the day of Discharge:  VITAL SIGNS:  Blood pressure (!) 164/72, pulse (!) 107, temperature 97.7 F (36.5 C), temperature source Oral, resp. rate (!) 24, height 6' 4"  (1.93 m), weight 108.4 kg (239 lb), SpO2 97 %. PHYSICAL EXAMINATION:  GGENERAL:  75 y.o.-year-old patient lying in the bed with no acute distress. More alert EYES: Pupils equal, round, reactive to light and accommodation. No scleral icterus. Extraocular muscles intact.  HEENT: Head atraumatic, normocephalic. Oropharynx and nasopharynx clear.  NECK:  Supple, no jugular venous distention. No thyroid enlargement, no tenderness.  LUNGS: Moderate breath sounds bilaterally, no wheezing, rales,rhonchi or crepitation. No use of accessory muscles of respiration. Diminished at the bases CARDIOVASCULAR: S1, S2 normal. No murmurs, rubs, or gallops.  ABDOMEN: Soft, tenderness and full in LUQ, abdomen appears distended. Hypoactive Bowel sounds present.  EXTREMITIES: No pedal edema, cyanosis, or clubbing.  NEUROLOGIC: Cranial nerves II through XII are intact. Muscle  strength 5/5 in all extremities. Sensation intact. Gait not checked. Global weakness noted. PSYCHIATRIC: The patient is intermittently awake SKIN: No obvious rash, lesion, or ulcer.   DATA REVIEW:   CBC  Recent Labs Lab 01/31/16 0724 01/31/16 1415  WBC 27.8*  --   HGB 10.2* 9.8*  HCT 31.5* 30.1*  PLT 411  --     Chemistries   Recent Labs Lab 01/31/16 0724  NA 137  K 4.2  CL 101  CO2 28  GLUCOSE 90  BUN 21*  CREATININE 0.72  CALCIUM 10.1  AST 27  ALT 12*  ALKPHOS 70  BILITOT 1.0     Microbiology Results  Results for orders placed or performed during the hospital encounter of 01/18/16  Urine culture     Status: Abnormal   Collection Time: 01/18/16  6:42 PM  Result Value Ref Range Status   Specimen Description URINE, RANDOM  Final   Special Requests NONE  Final   Culture MULTIPLE SPECIES PRESENT, SUGGEST RECOLLECTION (A)  Final   Report Status  01/20/2016 FINAL  Final  Blood culture (routine x 2)     Status: None   Collection Time: 01/18/16  8:26 PM  Result Value Ref Range Status   Specimen Description BLOOD RIGHT AC  Final   Special Requests BOTTLES DRAWN AEROBIC AND ANAEROBIC AER7CC ANA6CC  Final   Culture NO GROWTH 5 DAYS  Final   Report Status 01/23/2016 FINAL  Final  Blood culture (routine x 2)     Status: None   Collection Time: 01/18/16  8:26 PM  Result Value Ref Range Status   Specimen Description BLOOD LEFT HAND  Final   Special Requests BOTTLES DRAWN AEROBIC AND ANAEROBIC [place]  Final   Culture NO GROWTH 5 DAYS  Final   Report Status 01/23/2016 FINAL  Final  MRSA PCR Screening     Status: None   Collection Time: 01/20/16  3:09 PM  Result Value Ref Range Status   MRSA by PCR NEGATIVE NEGATIVE Final    Comment:        The GeneXpert MRSA Assay (FDA approved for NASAL specimens only), is one component of a comprehensive MRSA colonization surveillance program. It is not intended to diagnose MRSA infection nor to guide or monitor  treatment for MRSA infections.     RADIOLOGY:  No results found.   Management plans discussed with the patient, family and they are in agreement.  CODE STATUS:     Code Status Orders        Start     Ordered   01/31/16 1451  Do not attempt resuscitation (DNR)  Continuous    Question Answer Comment  In the event of cardiac or respiratory ARREST Do not call a "code blue"   In the event of cardiac or respiratory ARREST Do not perform Intubation, CPR, defibrillation or ACLS   In the event of cardiac or respiratory ARREST Use medication by any route, position, wound care, and other measures to relive pain and suffering. May use oxygen, suction and manual treatment of airway obstruction as needed for comfort.   Comments RN may pronounce, do not give morphine , comfort care      01/31/16 1450    Code Status History    Date Active Date Inactive Code Status Order ID Comments User Context   01/29/2016  1:12 PM 01/31/2016  2:50 PM DNR [id number]  Nicholes Mango, MD Inpatient   01/18/2016  9:15 PM 01/29/2016  1:12 PM DNR [id number]  Hillary Bow, MD ED    Advance Directive Documentation   Flowsheet Row Most Recent Value  Type of Advance Directive  Out of facility DNR (pink MOST or yellow form)  Pre-existing out of facility DNR order (yellow form or pink MOST form)  No data  "MOST" Form in Place?  No data      TOTAL TIME TAKING CARE OF THIS PATIENT: 60 minutes.    Harvie Bridge M.D on 02/02/2016 at 1:37 PM  Between 7am to 6pm - Pager - [phone]  After 6pm go to [email or web address] - Technical brewer [facility]  Office  [phone]  CC: Primary care physician; No PCP Per Patient   Note: This dictation was prepared with Dragon dictation along with smaller phrase technology. Any transcriptional errors that result from this process are unintentional.

## 2016-02-02 NOTE — Progress Notes (Addendum)
1:07 PM LCSW made contact with patient son. He has chosen [facility]. LCSW completed referral for hospice home and [facility] will follow up with son and if bed is available for today. Will notify MD and have her sign DNR. Plan is hospice home by EMS. All is complete with discharge. Hospice home is working on meeting with son to complete transition.   LCSW has called son twice to discuss steps with residential hospice and offer choice. Unable to reach son but will continue to try and reach.   Message will be left for son as number on chart has been verified by treatment team as son's phone number.  Will continue to try and reach son and assist with disposition.  Lane Hacker, MSW Clinical Social Work: [employer] for :  [phone]

## 2016-02-02 NOTE — Progress Notes (Signed)
CH made a follow up visit. Pt condition has deteriorated since my last rounding. Hospice care has been contacted and are hopeful to transition him to hospice house today. Silent prayer was offered at the door. CH is available for follow up as needed.    02/02/16 1400  Clinical Encounter Type  Visited With Patient;Health care provider  Visit Type Follow-up;Spiritual support  Referral From Nurse  Spiritual Encounters  Spiritual Needs Prayer

## 2016-02-02 NOTE — Progress Notes (Signed)
Follow up note on new hospice home referral. Brian Hughes is a 75 year old man with a PMH of  HTN and asthma admitted to [facility] on 11/2 with abdominal pain. Per chart note review he had been having weakness and intermittent confusion. He has had a 40 lb weight loss in 3 months, decreased appetite, and increased urination. Hypokalemia and hypercalcemia on blood work. CT scan of abdomen/pelvis revealed large 20cm mass extending over the bowel, spleen, and pancreas. Also, 6.4 cm abdominal aortic aneurysm. Patient seen by oncology and radiation oncology. 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 Other lab work pending. On 11/8, he had AAA repair. On 11/10 received chemotherapy with significant reaction of rigors and desaturation-back to baseline within one hour. On 11/13, patient was to receive chemotherapy again but experienced a large, bloody bowel movement after pre-medications given. GI consulted-he received a blood transfusion.  EGD performed on  11/15. Palliative medicine was consulted on 11/15 for goals of care.  Patient and his son 79 met with Palliative Medicine NP Brian Hughes and have chosen to focus on his comfort at the hospice home. Of note he has had significant pain requiring dilaudid and oxycodone, Fentanyl patch has been increased to 50 mcg, as well as restlessness and agitation requiring lorazepam and haldol. After several attempts to reach Brian Hughes via telephone today, writer was able to speak with him this afternoon to initiate education regarding hospice services, philosophy and team approach to care with understanding voiced. Brian Hughes has not transportation and will be taking a taxi to the hospice Home to sign consents. Emotional support given. Patient information faxed to hospice referral. Report called to the hospice home. EMS notified for transport at 5 Pm.  Hospital care team all aware of and in agreement with plan for patient to discharge to the hospice home today. Signed  portable DNR in place in discharge packet. Thank you for the opportunity to be involved in the care of this patient.  Brian Shanks RN, BSN, [place] and [facility] [place], [facility] [phone] c

## 2016-02-02 NOTE — Progress Notes (Signed)
MD making rounds. Order received to discharge to [facility]. Remains on Comfort Care. Oxycodone Liquid, dilaudid oral and ativan oral given for comfort. Keeps calm for a short amount of time before showing s/s discomfort again. Hospice liason facilitating transfer to [facility]. EMS called for transport. EMS on Unit for transport. Hospice Liason giving report to EMT. Transported by stretcher via EMS.

## 2016-02-02 NOTE — Progress Notes (Signed)
New Hospice home referral received from [facility] following a Palliative Medicine consult on 11/16. Writer has attempted to contact Brian Hughes' son Brian Hughes x 2 to discuss pl;ans for discharge today to the hospice. Will continue to try to reach him. Hospital care team aware.  Flo Shanks RN, BSN, [place] and [facility] of [place], [facility] [phone] c

## 2016-02-05 ENCOUNTER — Ambulatory Visit: Payer: Medicare Other

## 2016-02-06 ENCOUNTER — Ambulatory Visit: Payer: Medicare Other

## 2016-02-07 ENCOUNTER — Ambulatory Visit: Payer: Medicare Other

## 2016-02-07 LAB — SURGICAL PATHOLOGY

## 2016-02-12 ENCOUNTER — Ambulatory Visit: Payer: Medicare Other

## 2016-02-13 ENCOUNTER — Encounter (INDEPENDENT_AMBULATORY_CARE_PROVIDER_SITE_OTHER): Payer: Medicare Other | Admitting: Vascular Surgery

## 2016-02-13 ENCOUNTER — Ambulatory Visit: Payer: Medicare Other

## 2016-02-14 ENCOUNTER — Ambulatory Visit: Payer: Medicare Other

## 2016-02-15 ENCOUNTER — Ambulatory Visit: Payer: Medicare Other

## 2016-02-16 ENCOUNTER — Ambulatory Visit: Payer: Medicare Other

## 2016-02-16 DEATH — deceased

## 2016-02-19 ENCOUNTER — Ambulatory Visit: Payer: Medicare Other

## 2016-02-20 ENCOUNTER — Ambulatory Visit: Payer: Medicare Other

## 2016-02-22 ENCOUNTER — Encounter: Payer: Self-pay | Admitting: Interventional Radiology

## 2016-03-04 ENCOUNTER — Ambulatory Visit: Admit: 2016-03-04 | Payer: Medicare Other | Admitting: Unknown Physician Specialty

## 2016-03-04 SURGERY — COLONOSCOPY WITH PROPOFOL
Anesthesia: General

## 2018-06-16 IMAGING — CR DG ABDOMEN ACUTE W/ 1V CHEST
1 series · 4 of 4 positions shown · non-contrast
Comparison: None.

CLINICAL DATA: Abdominal pain, last bowel movement yesterday.

EXAM:
DG ABDOMEN ACUTE W/ 1V CHEST

[Series 1: dg abd acute w/chest · 0.14mm/px · 4 of 4 slices shown]
[im 1/4]
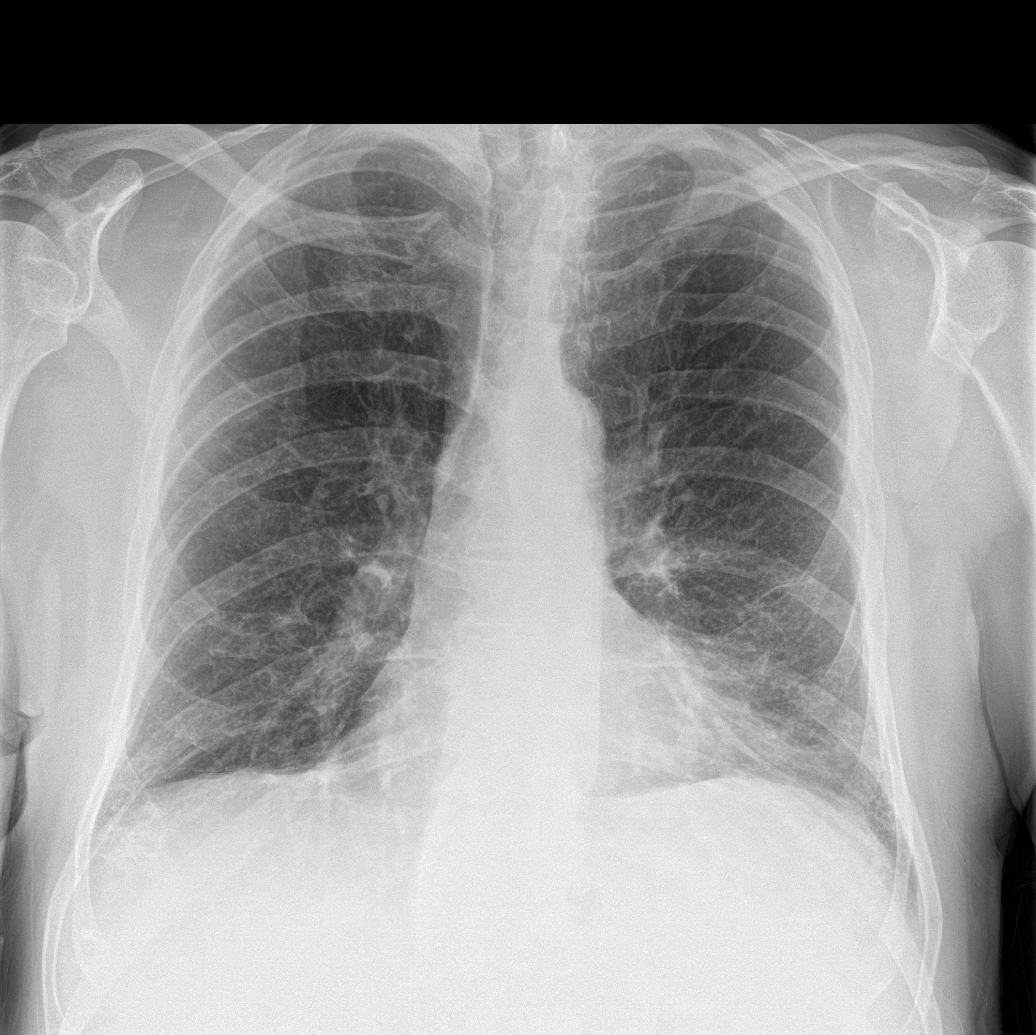
[im 2/4]
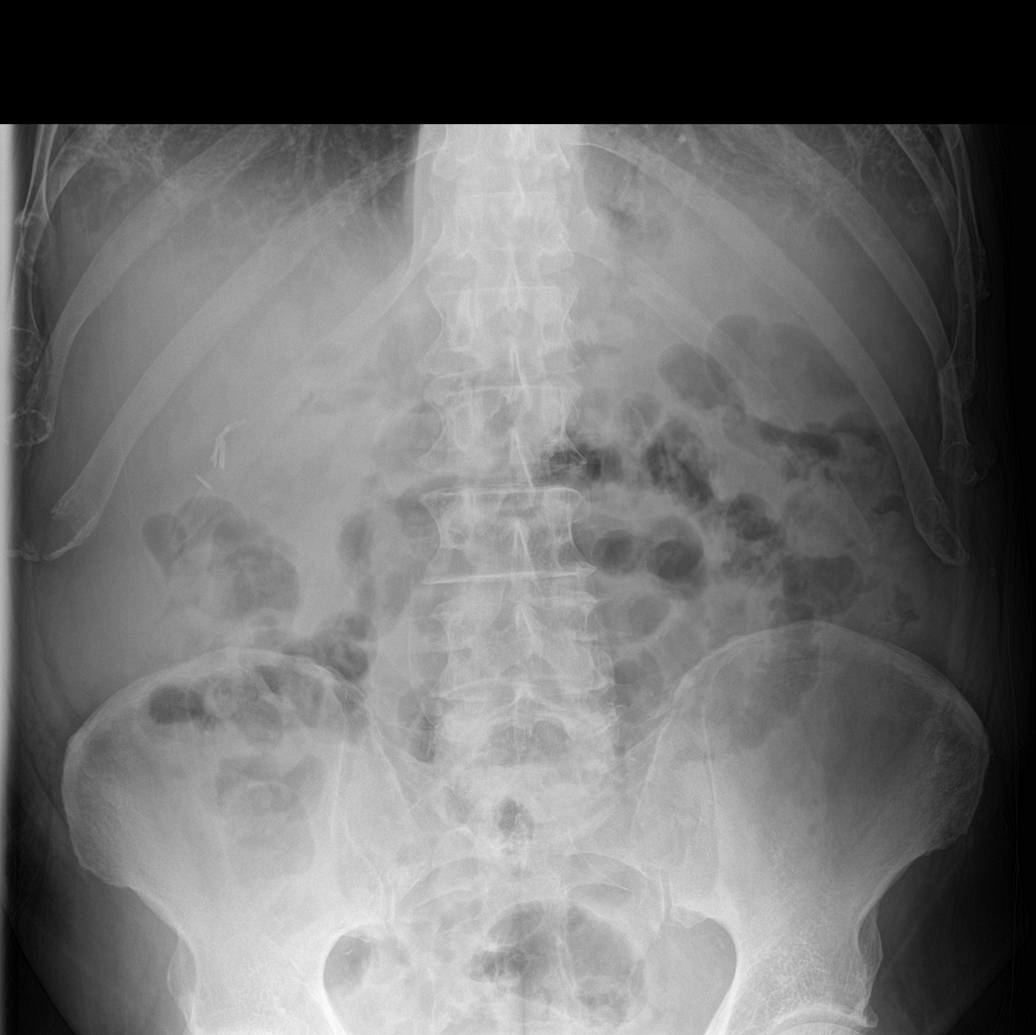
[im 3/4]
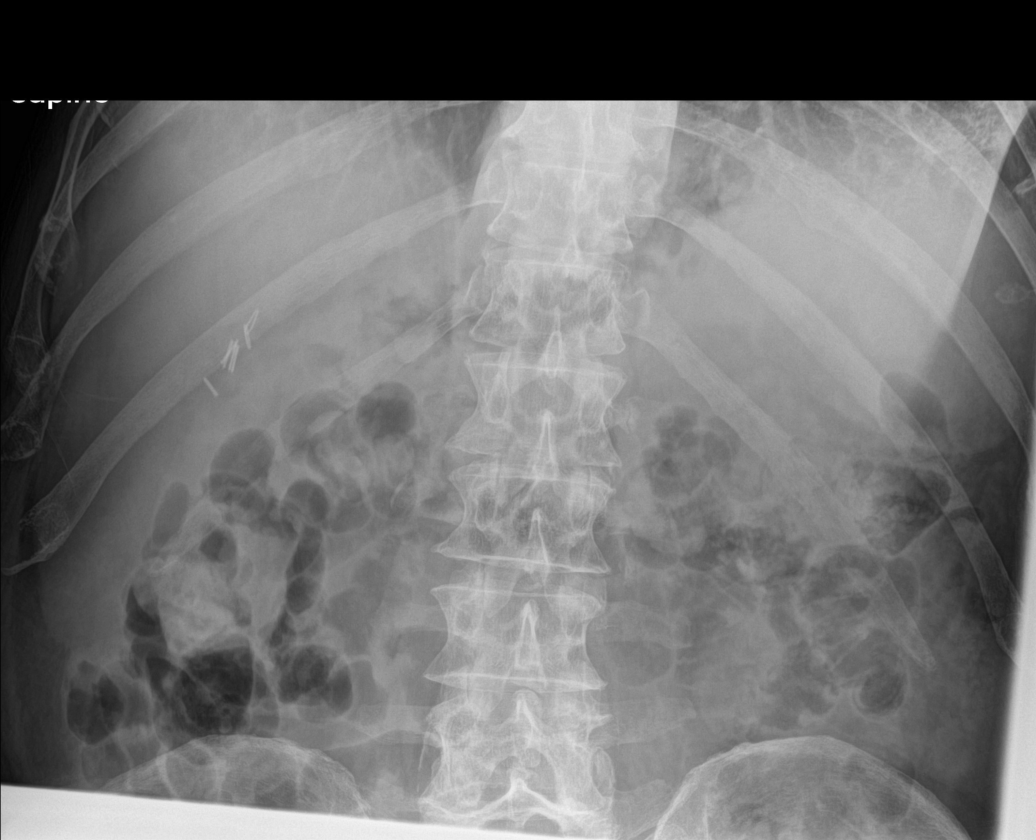
[im 4/4]
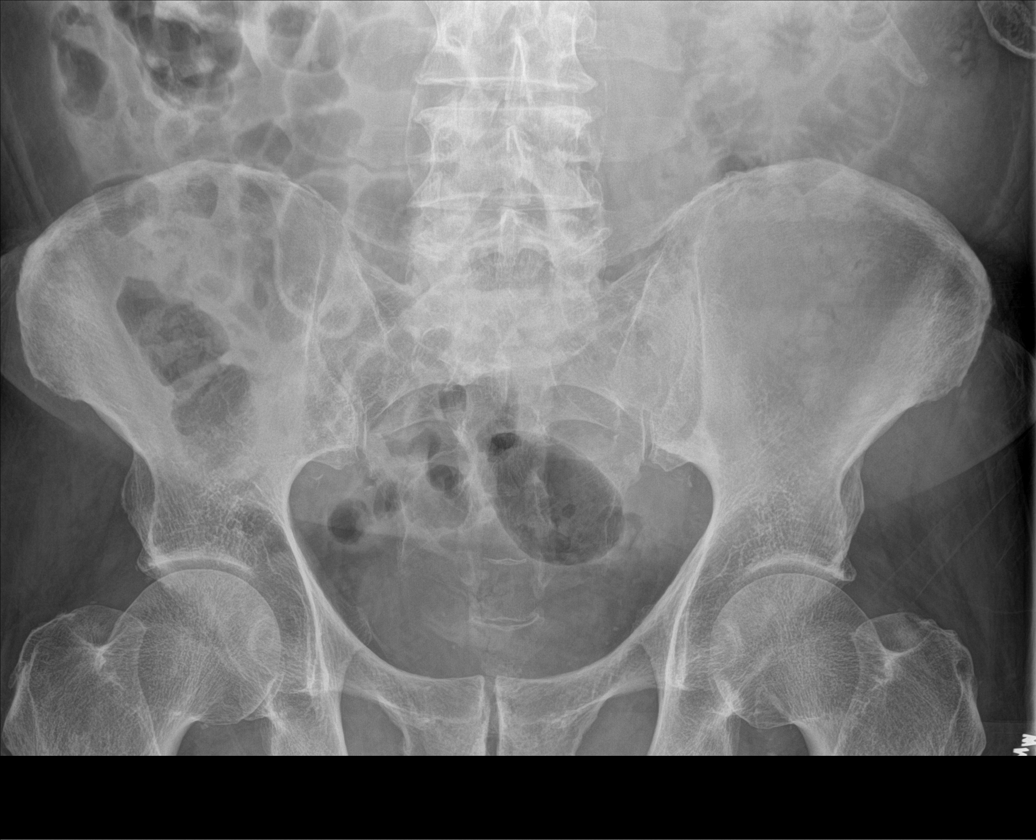

[4 of 4 positions shown; findings below may reference images not displayed]

FINDINGS: Cardiomediastinal silhouette is normal. Increased lung volumes with
mild chronic bronchitic changes and bibasilar interstitial
prominence. Lungs are clear, no pleural effusions. No pneumothorax.
Soft tissue planes and included osseous structures are unremarkable.

Bowel gas pattern is nondilated and nonobstructive. Surgical clips
in the included right abdomen compatible with cholecystectomy. No
intra-abdominal mass effect, pathologic calcifications or free air.
Faint linear calcifications lateral to the lumbar spine. Soft tissue
planes and included osseous structures are non-suspicious.
IMPRESSION: COPD with bibasilar scarring/fibrosis.

Normal bowel gas pattern. Possible infrarenal aortic calcified
aneurysm.

## 2018-07-14 IMAGING — CT CT HEAD W/O CM
4 series · 16 of 47 positions shown, 18 images · non-contrast
Comparison: CT Abdomen and Pelvis from today reported separately.

CLINICAL DATA: 74-year-old male with abdominal pain, large
abdominal tumor, increased weakness. Falls and inability to walk.
Initial encounter.

EXAM:
CT HEAD WITHOUT CONTRAST
TECHNIQUE: Contiguous axial images were obtained from the base of the skull
through the vertex without intravenous contrast.

[Series 2: head wo · axial · 0.42mm/px · z∈[-76,+34]mm · 7 of 30 slices shown, 9 images]
[im 4/30  brain]
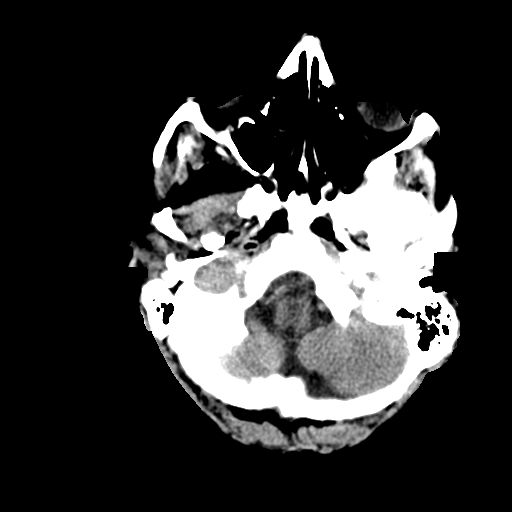
[im 4/30  bone]
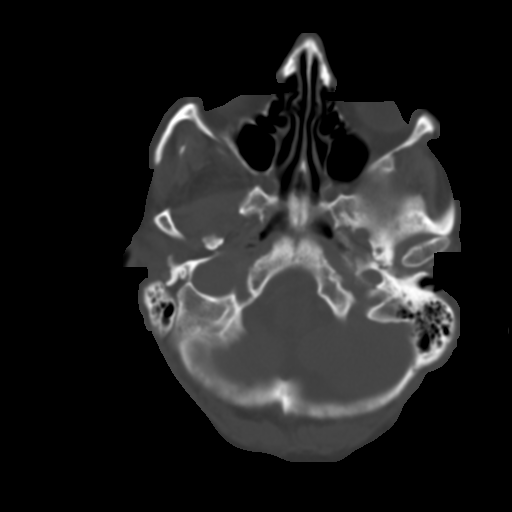
[im 8/30  brain]
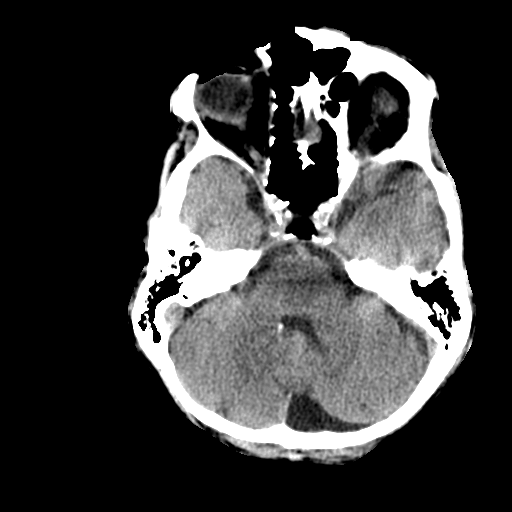
[im 11/30  brain]
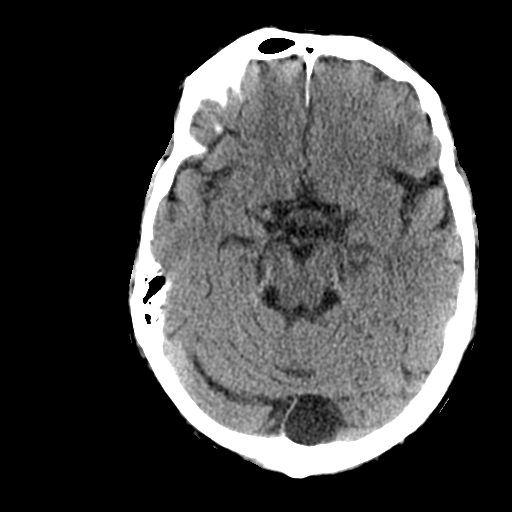
[im 15/30  brain]
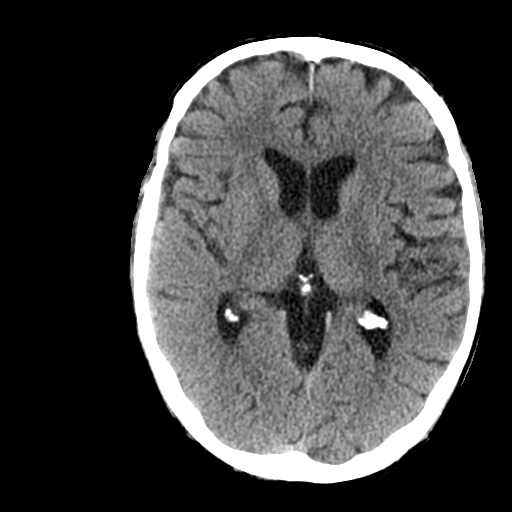
[im 19/30  brain]
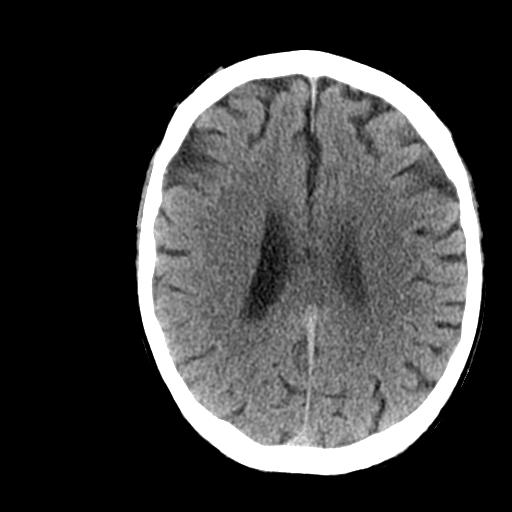
[im 19/30  bone]
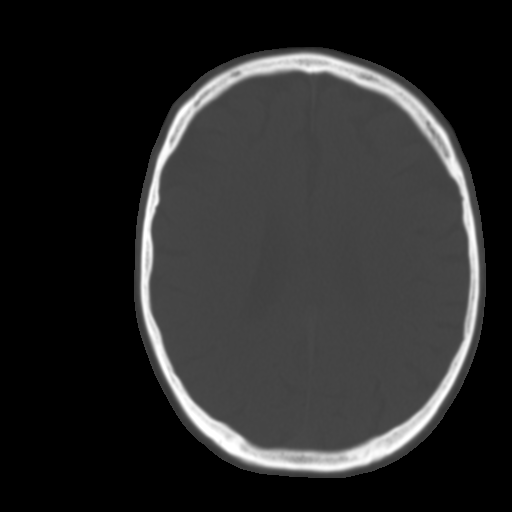
[im 22/30  brain]
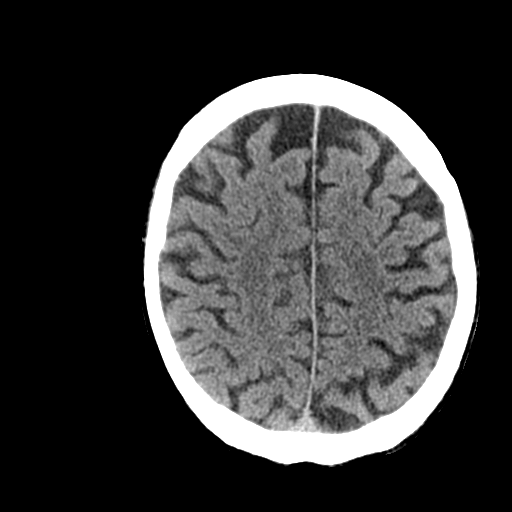
[im 26/30  brain]
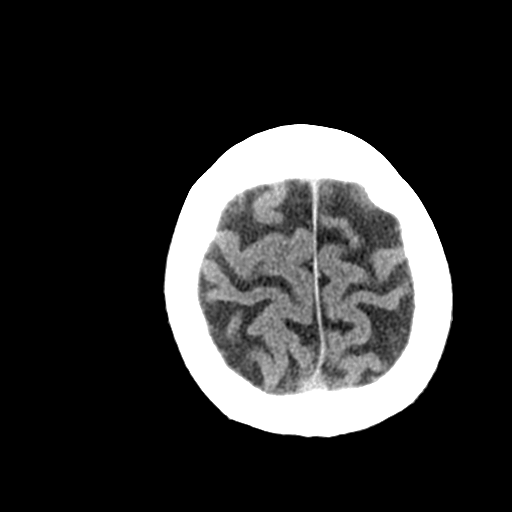

[Series 3: head bone · axial · 0.42mm/px · z∈[-77,-47]mm · 3 of 75 slices shown]
[im 8/75  bone]
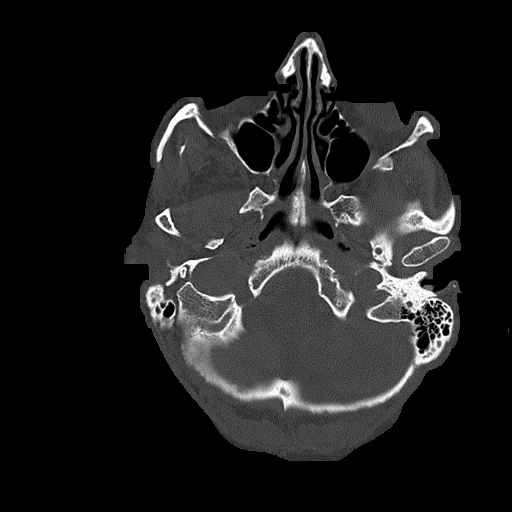
[im 15/75  bone]
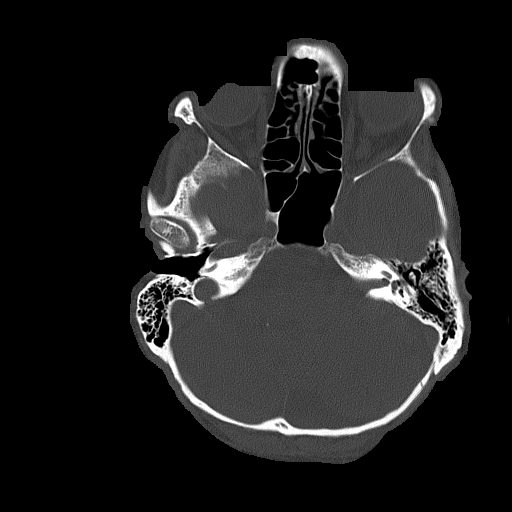
[im 23/75  bone]
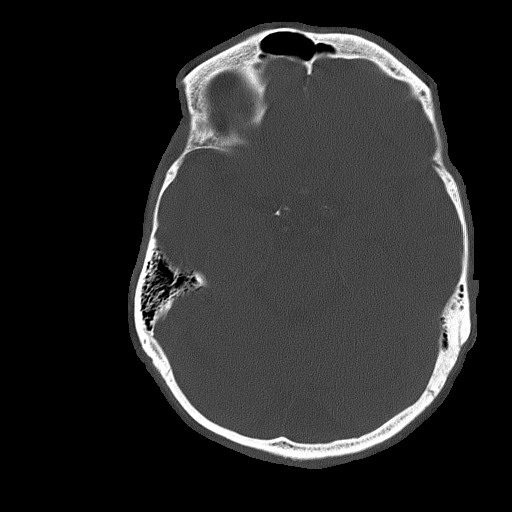

[Series 4: coronal soft tissue · coronal · 0.30mm/px · 3 of 62 slices shown]
[im 21/62  brain]
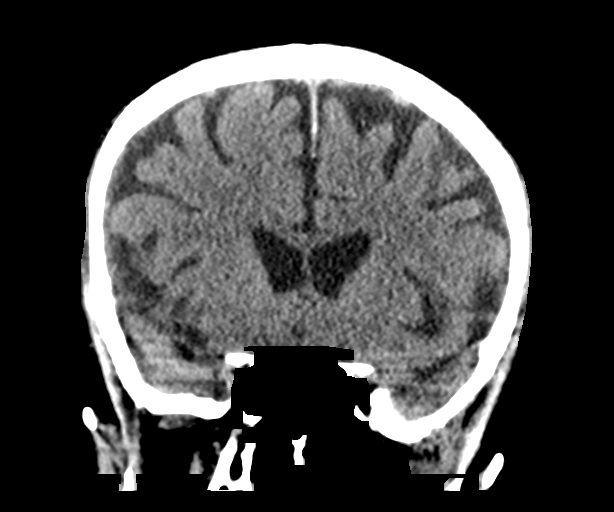
[im 28/62  brain]
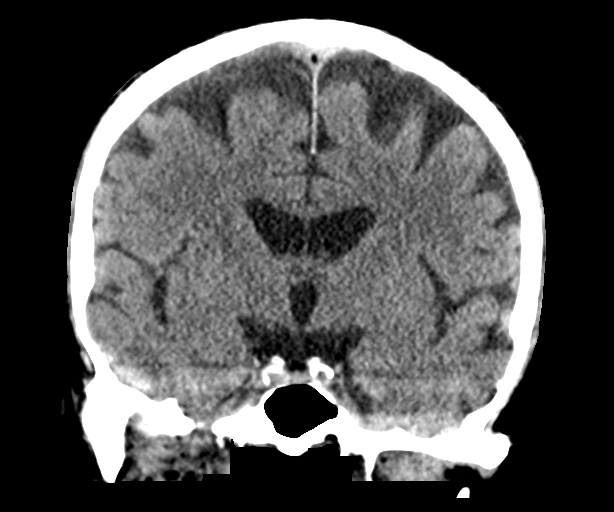
[im 34/62  brain]
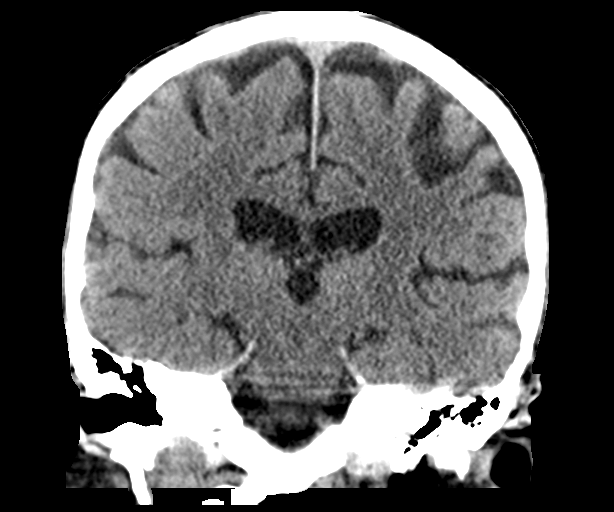

[Series 5: sagittal soft tissue · sagittal · 0.31mm/px · 3 of 53 slices shown]
[im 18/53  brain]
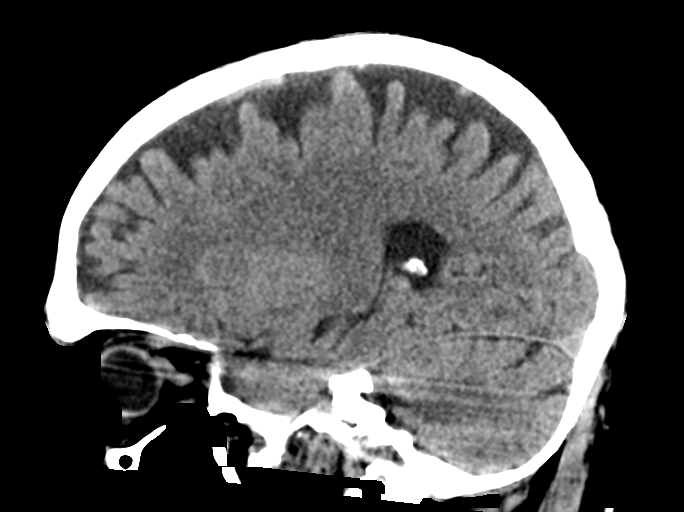
[im 27/53  brain]
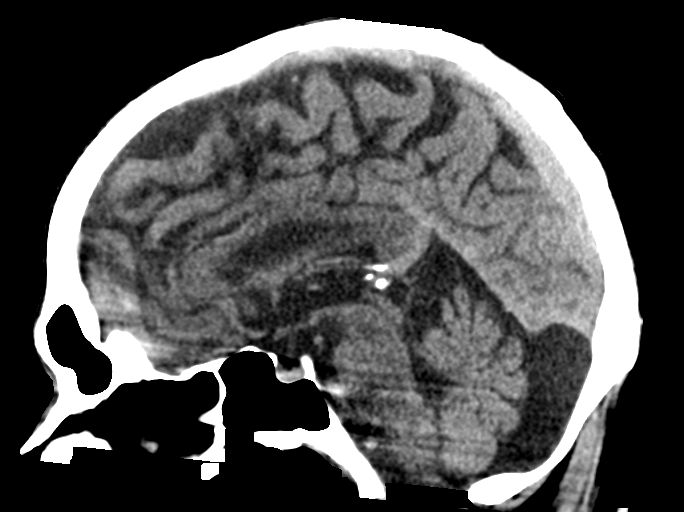
[im 35/53  brain]
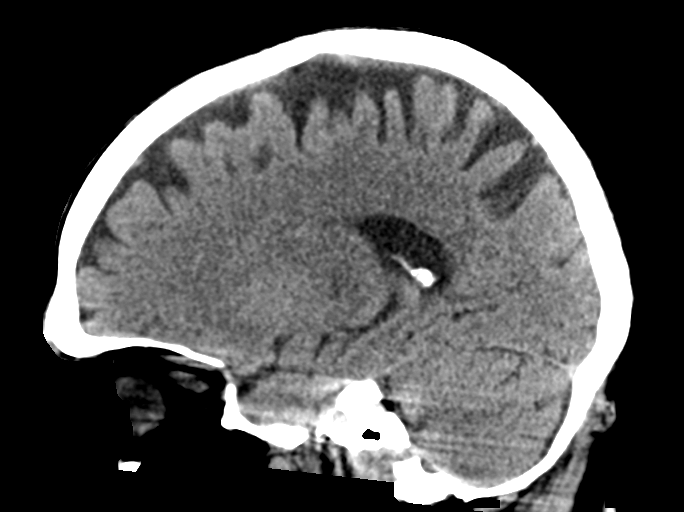

[16 of 47 positions shown; findings below may reference images not displayed]

FINDINGS: Brain: Small left posterior fossa arachnoid cyst or mega cisterna
magna variant, appears inconsequential. No midline shift,
ventriculomegaly, mass effect, parenchymal mass lesion, intracranial
hemorrhage or evidence of cortically based acute infarction.
Gray-white matter differentiation is within normal limits throughout
the brain. Cerebral volume is within normal limits for age.

Vascular: Calcified atherosclerosis at the skull base.

Skull: No osseous abnormality identified.

Sinuses/Orbits: Visualized paranasal sinuses and mastoids are well
pneumatized. Right maxillary periosteal thickening.

Other: No acute orbit or scalp soft tissue finding.
IMPRESSION: Essentially normal for age noncontrast CT appearance of the brain.

Note that early metastatic disease to the brain is difficult to
exclude in the absence of intravenous contrast, and Brain MRI
without and with contrast would best stage the brain as necessary.
